# Patient Record
Sex: Male | Born: 1990
Health system: Southern US, Community
[De-identification: ages and names within clinical notes are randomized; demographics above are authoritative.]

## PROBLEM LIST (undated history)

## (undated) DIAGNOSIS — F419 Anxiety disorder, unspecified: Secondary | ICD-10-CM

## (undated) HISTORY — DX: Anxiety disorder, unspecified: F41.9

## (undated) HISTORY — PX: VASECTOMY: SHX75

---
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
## 2012-08-23 ENCOUNTER — Emergency Department: Payer: Self-pay | Admitting: Emergency Medicine

## 2015-01-27 ENCOUNTER — Telehealth: Payer: Self-pay | Admitting: Family Medicine

## 2015-01-27 NOTE — Telephone Encounter (Signed)
Please advise 

## 2015-01-27 NOTE — Telephone Encounter (Signed)
Ok to re-establish 

## 2015-01-27 NOTE — Telephone Encounter (Signed)
Pt called to see if he can get reestablished with Dr. Sherrie MustacheFisher pt states both parents see Dr. Sherrie MustacheFisher, pt states he has not seen any other Dr seen , pt states he just got insured with [employer]. BC# [phone]. Thanks CC

## 2015-01-27 NOTE — Telephone Encounter (Signed)
I spoke with pt and scheduled his re-establish appt on Friday 01/31/15. Thanks TNP

## 2015-01-31 ENCOUNTER — Encounter: Payer: Self-pay | Admitting: Family Medicine

## 2015-01-31 ENCOUNTER — Ambulatory Visit (INDEPENDENT_AMBULATORY_CARE_PROVIDER_SITE_OTHER): Payer: BLUE CROSS/BLUE SHIELD | Admitting: Family Medicine

## 2015-01-31 VITALS — BP 130/88 | HR 67 | Temp 97.8°F | Resp 16 | Ht 72.18 in | Wt 250.0 lb

## 2015-01-31 DIAGNOSIS — F40248 Other situational type phobia: Secondary | ICD-10-CM | POA: Insufficient documentation

## 2015-01-31 DIAGNOSIS — F40298 Other specified phobia: Secondary | ICD-10-CM | POA: Diagnosis not present

## 2015-01-31 MED ORDER — ALPRAZOLAM 0.5 MG PO TABS: 0.2500 mg | ORAL_TABLET | ORAL | Status: DC | PRN

## 2015-01-31 NOTE — Progress Notes (Signed)
Patient: Parker Heath Male    DOB: Jul 14, 1990   24 y.o.   MRN: [id number] Visit Date: 01/31/2015  Today's Provider: Mila Merry, MD   Chief Complaint  Patient presents with  . Establish Care   Subjective:    Anxiety Presents for initial visit. Onset was 6 to 12 months ago (1 yesr). The problem has been waxing and waning. Symptoms include dry mouth, excessive worry, feeling of choking, malaise, muscle tension, nausea, nervous/anxious behavior and panic. Patient reports no chest pain, compulsions, confusion, decreased concentration, depressed mood, dizziness, hyperventilation, impotence, insomnia, irritability, obsessions, palpitations, restlessness, shortness of breath or suicidal ideas. Symptoms occur occasionally. The severity of symptoms is severe, causing significant distress and interfering with daily activities. Exacerbated by: driving for long distances. The patient sleeps 7 hours per night. The quality of sleep is non-restorative. Nighttime awakenings: none.   Risk factors: driving for long distances  Past treatments include counseling (CBT) (behavoral health). The treatment provided no relief. Compliance with prior treatments has been good.   He states had previously worked for [employer] but had to quit because of anxiety attacks when he travelled away from [place]. He now works for [employer]. He feels it caused by being away from home in unfamiliar areas. He has episodes even when traveling with his family and having his wife drive.   No Known Allergies Previous Medications   No medications on file    Review of Systems  Constitutional: Positive for activity change. Negative for irritability.  Eyes: Negative.   Respiratory: Negative.  Negative for shortness of breath.   Cardiovascular: Negative.  Negative for chest pain and palpitations.  Gastrointestinal: Positive for nausea.  Endocrine: Negative.   Genitourinary: Negative.  Negative for impotence.    Musculoskeletal: Negative.   Skin: Negative.   Allergic/Immunologic: Negative.   Neurological: Negative.  Negative for dizziness.  Hematological: Negative.   Psychiatric/Behavioral: Negative for suicidal ideas, confusion and decreased concentration. The patient is nervous/anxious. The patient does not have insomnia.     Social History  Substance Use Topics  . Smoking status: Current Every Day Smoker -- 5 years  . Smokeless tobacco: Not on file  . Alcohol Use: No   Objective:   BP 130/88 mmHg  Pulse 67  Temp(Src) 97.8 F (36.6 C) (Oral)  Resp 16  Ht 6' 0.18" (1.833 m)  Wt 250 lb (113.399 kg)  BMI 33.75 kg/m2  SpO2 97%   Depression screen [facility] 2/9 01/31/2015  Decreased Interest 0  Down, Depressed, Hopeless 0  PHQ - 2 Score 0  Altered sleeping 1  Tired, decreased energy 1  Change in appetite 0  Feeling bad or failure about yourself  0  Trouble concentrating 0  Moving slowly or fidgety/restless 0  Suicidal thoughts 0  PHQ-9 Score 2  Difficult doing work/chores Somewhat difficult      Physical Exam  General appearance: alert, well developed, well nourished, cooperative and in no distress Head: Normocephalic, without obvious abnormality, atraumatic Lungs: Respirations even and unlabored Extremities: No gross deformities Skin: Skin color, texture, turgor normal. No rashes seen  Psych: Appropriate mood and affect. Neurologic: Mental status: Alert, oriented to person, place, and time, thought content appropriate.     Assessment & Plan:     1. Travel anxiety Has had some psychological counseling for this. Discusses options for prn versus maintenance therapy. Consider episodes are infrequent, but interfering with ability to travel periodically with family, he will try prn alprazolam. Advised  that medication may cause sedation and his wife should drive when he does take medication.  - ALPRAZolam (XANAX) 0.5 MG tablet; Take 0.5-1 tablets (0.25-0.5 mg total) by mouth every  4 (four) hours as needed for anxiety.  Dispense: 20 tablet; Refill: 1       Mila Merryonald Robbi Spells, MD  [facility]

## 2015-11-19 ENCOUNTER — Institutional Professional Consult (permissible substitution): Payer: Self-pay | Admitting: Medical

## 2016-04-08 ENCOUNTER — Ambulatory Visit: Payer: 59

## 2017-07-25 DIAGNOSIS — T1512XA Foreign body in conjunctival sac, left eye, initial encounter: Secondary | ICD-10-CM | POA: Diagnosis not present

## 2017-08-22 ENCOUNTER — Ambulatory Visit: Payer: 59 | Admitting: Urology

## 2017-08-25 ENCOUNTER — Encounter: Payer: Self-pay | Admitting: Urology

## 2017-10-11 ENCOUNTER — Encounter: Payer: Self-pay | Admitting: Emergency Medicine

## 2017-10-11 ENCOUNTER — Emergency Department
Admission: EM | Admit: 2017-10-11 | Discharge: 2017-10-11 | Disposition: A | Discharge status: Home / Self Care | Payer: No Typology Code available for payment source | Attending: Emergency Medicine | Admitting: Emergency Medicine

## 2017-10-11 ENCOUNTER — Other Ambulatory Visit: Payer: Self-pay

## 2017-10-11 DIAGNOSIS — T2662XA Corrosion of cornea and conjunctival sac, left eye, initial encounter: Secondary | ICD-10-CM | POA: Insufficient documentation

## 2017-10-11 DIAGNOSIS — X58XXXA Exposure to other specified factors, initial encounter: Secondary | ICD-10-CM | POA: Insufficient documentation

## 2017-10-11 DIAGNOSIS — Y929 Unspecified place or not applicable: Secondary | ICD-10-CM | POA: Insufficient documentation

## 2017-10-11 DIAGNOSIS — Y990 Civilian activity done for income or pay: Secondary | ICD-10-CM | POA: Diagnosis not present

## 2017-10-11 DIAGNOSIS — T2661XA Corrosion of cornea and conjunctival sac, right eye, initial encounter: Secondary | ICD-10-CM | POA: Insufficient documentation

## 2017-10-11 DIAGNOSIS — Y93E5 Activity, floor mopping and cleaning: Secondary | ICD-10-CM | POA: Diagnosis not present

## 2017-10-11 DIAGNOSIS — F17210 Nicotine dependence, cigarettes, uncomplicated: Secondary | ICD-10-CM | POA: Insufficient documentation

## 2017-10-11 DIAGNOSIS — T541X1A Toxic effect of other corrosive organic compounds, accidental (unintentional), initial encounter: Secondary | ICD-10-CM | POA: Diagnosis present

## 2017-10-11 MED ORDER — POLYMYXIN B-TRIMETHOPRIM 10000-0.1 UNIT/ML-% OP SOLN: 2.0000 [drp] | Freq: Four times a day (QID) | OPHTHALMIC | 0 refills | Status: DC

## 2017-10-11 MED ORDER — TETRACAINE HCL 0.5 % OP SOLN
2.0000 [drp] | Freq: Once | OPHTHALMIC | Status: AC
  Administered 2017-10-11: 2 [drp] via OPHTHALMIC

## 2017-10-11 MED ORDER — FLUORESCEIN SODIUM 1 MG OP STRP
ORAL_STRIP | OPHTHALMIC | Status: AC
  Administered 2017-10-11: 2 via OPHTHALMIC
  Filled 2017-10-11: qty 2

## 2017-10-11 MED ORDER — TETRACAINE HCL 0.5 % OP SOLN
OPHTHALMIC | Status: AC
  Administered 2017-10-11: 2 [drp] via OPHTHALMIC
  Filled 2017-10-11: qty 4

## 2017-10-11 MED ORDER — KETOROLAC TROMETHAMINE 0.5 % OP SOLN: 1.0000 [drp] | Freq: Four times a day (QID) | OPHTHALMIC | 0 refills | Status: DC

## 2017-10-11 MED ORDER — FLUORESCEIN SODIUM 1 MG OP STRP
2.0000 | ORAL_STRIP | Freq: Once | OPHTHALMIC | Status: AC
  Administered 2017-10-11: 2 via OPHTHALMIC

## 2017-10-11 NOTE — ED Notes (Signed)

## 2017-10-11 NOTE — ED Notes (Signed)
Pt reports drug screening performed at employer's already.
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
## 2017-10-11 NOTE — ED Notes (Signed)
PA, Cuthriell to bedside at this time.

## 2018-02-15 ENCOUNTER — Encounter: Payer: Self-pay | Admitting: Family Medicine

## 2018-02-15 ENCOUNTER — Ambulatory Visit (INDEPENDENT_AMBULATORY_CARE_PROVIDER_SITE_OTHER): Payer: BLUE CROSS/BLUE SHIELD | Admitting: Family Medicine

## 2018-02-15 VITALS — BP 120/90 | HR 110 | Temp 98.4°F | Resp 16 | Ht 72.0 in | Wt 236.0 lb

## 2018-02-15 DIAGNOSIS — F418 Other specified anxiety disorders: Secondary | ICD-10-CM

## 2018-02-15 DIAGNOSIS — R634 Abnormal weight loss: Secondary | ICD-10-CM

## 2018-02-15 DIAGNOSIS — F40248 Other situational type phobia: Secondary | ICD-10-CM

## 2018-02-15 MED ORDER — ALPRAZOLAM 0.5 MG PO TABS: 0.2500 mg | ORAL_TABLET | ORAL | 1 refills | Status: DC | PRN

## 2018-02-15 MED ORDER — FLUOXETINE HCL 20 MG PO CAPS: 20.0000 mg | ORAL_CAPSULE | Freq: Every day | ORAL | 1 refills | Status: DC

## 2018-02-15 NOTE — Patient Instructions (Signed)
.   Please bring all of your medications to every appointment so we can make sure that our medication list is the same as yours.   

## 2018-02-15 NOTE — Progress Notes (Signed)
Patient: Parker Heath Male    DOB: Aug 16, 1990   27 y.o.   MRN: [id number] Visit Date: 02/15/2018  Today's Provider: Mila Merryonald Fisher, MD   Chief Complaint  Patient presents with  . Establish Care   Subjective:     HPI Re- Establish Care: Patient was a [facility] patient [facility]. His last visit there was 05/07/2016. During that visit, he was seen for Anxiety and Panic Attacks. He was prescribed Fluoxetine and Alprazolam. Patient comes in today to re-establish care and to Discuss issues with Anxiety. He was last seen here in 2016  Anxiety: He reports long history of anxiety with travel for which he has taken alprazolam intermittently . He [facility] as [facility], but only took it for a week or two. He reports that anxiety has become much more persistent and severe over the last month or two. States he is constantly worried about getting the flu. States he has had tendency to think obsessively in the past, but this has been much worse. Feels nervous and jittery, heart races, but also feels sleepy all the time and never wants to get out of bed. He has poor appetite and feels like he has lost weight over the last month.    Wt Readings from Last 3 Encounters:  02/15/18 236 lb (107 kg)  10/11/17 255 lb (115.7 kg)  01/31/15 250 lb (113.4 kg)      No Known Allergies   Current Outpatient Medications:  .  ALPRAZolam (XANAX) 0.5 MG tablet, Take 0.5-1 tablets (0.25-0.5 mg total) by mouth every 4 (four) hours as needed for anxiety. (Patient not taking: Reported on 02/15/2018), Disp: 20 tablet, Rfl: 1  Review of Systems  Constitutional: Positive for unexpected weight change (lost 20 pounds in the past month). Negative for appetite change, chills and fever.  Respiratory: Negative for chest tightness, shortness of breath and wheezing.   Cardiovascular: Negative for chest pain and palpitations.  Gastrointestinal: Negative for abdominal pain, nausea and  vomiting.  Psychiatric/Behavioral: Negative for agitation, decreased concentration, dysphoric mood, hallucinations, self-injury, sleep disturbance and suicidal ideas. The patient is nervous/anxious.     Social History   Tobacco Use  . Smoking status: Former Smoker    Years: 2.00    Types: Cigarettes  . Smokeless tobacco: Never Used  Substance Use Topics  . Alcohol use: No    Alcohol/week: 0.0 standard drinks    Past Medical History:  Diagnosis Date  . Anxiety    Past Surgical History:  Procedure Laterality Date  . TOE SURGERY Left 2006   family history includes Anxiety disorder in his brother; Heart Problems in his brother; Heart disease in his maternal grandmother; Prostate cancer in his father.    Objective:   BP 120/90 (BP Location: Right Arm, Patient Position: Sitting, Cuff Size: Large)   Pulse (!) 110   Temp 98.4 F (36.9 C) (Oral)   Resp 16   Ht 6' (1.829 m)   Wt 236 lb (107 kg)   SpO2 98% Comment: room air  BMI 32.01 kg/m  Vitals:   02/15/18 1058  BP: 120/90  Pulse: (!) 110  Resp: 16  Temp: 98.4 F (36.9 C)  TempSrc: Oral  SpO2: 98%  Weight: 236 lb (107 kg)  Height: 6' (1.829 m)     Physical Exam   General Appearance:    Alert, cooperative, no distress  Eyes:    PERRL, conjunctiva/corneas clear, EOM's intact  Lungs:     Clear to auscultation bilaterally, respirations unlabored  Heart:    Tachycardic, regular rhythm  Neurologic:   Awake, alert, oriented x 3. No apparent focal neurological           defect.          Assessment & Plan    1. Weight loss  - Comprehensive metabolic panel - CBC - TSH  2. Travel anxiety refill - ALPRAZolam (XANAX) 0.5 MG tablet; Take 0.5-1 tablets (0.25-0.5 mg total) by mouth every 4 (four) hours as needed for anxiety.  Dispense: 20 tablet; Refill: 1  3. Anxiety about health Start back on - FLUoxetine (PROZAC) 20 MG capsule; Take 1 capsule (20 mg total) by mouth daily.  Dispense: 30 capsule; Refill:  1 He does have some sx of OCD and will likely benefit from SSRI.   Return in about 4 weeks (around 03/15/2018).     Mila Merryonald Fisher, MD  [facility]

## 2018-02-16 ENCOUNTER — Telehealth: Payer: Self-pay

## 2018-02-16 LAB — COMPREHENSIVE METABOLIC PANEL
ALBUMIN: 5.1 g/dL (ref 3.5–5.5)
ALT: 21 IU/L (ref 0–44)
AST: 16 IU/L (ref 0–40)
Albumin/Globulin Ratio: 2.3 — ABNORMAL HIGH (ref 1.2–2.2)
Alkaline Phosphatase: 99 IU/L (ref 39–117)
BUN / CREAT RATIO: 12 (ref 9–20)
BUN: 12 mg/dL (ref 6–20)
Bilirubin Total: 0.7 mg/dL (ref 0.0–1.2)
CALCIUM: 10.4 mg/dL — AB (ref 8.7–10.2)
CO2: 22 mmol/L (ref 20–29)
CREATININE: 0.99 mg/dL (ref 0.76–1.27)
Chloride: 103 mmol/L (ref 96–106)
GFR, EST AFRICAN AMERICAN: 122 mL/min/{1.73_m2} (ref >59)
GFR, EST NON AFRICAN AMERICAN: 105 mL/min/{1.73_m2} (ref >59)
GLUCOSE: 98 mg/dL (ref 65–99)
Globulin, Total: 2.2 g/dL (ref 1.5–4.5)
Potassium: 4.3 mmol/L (ref 3.5–5.2)
Sodium: 140 mmol/L (ref 134–144)
TOTAL PROTEIN: 7.3 g/dL (ref 6.0–8.5)

## 2018-02-16 LAB — CBC
HEMATOCRIT: 48.7 % (ref 37.5–51.0)
Hemoglobin: 16.6 g/dL (ref 13.0–17.7)
MCH: 29.4 pg (ref 26.6–33.0)
MCHC: 34.1 g/dL (ref 31.5–35.7)
MCV: 86 fL (ref 79–97)
Platelets: 374 10*3/uL (ref 150–450)
RBC: 5.65 x10E6/uL (ref 4.14–5.80)
RDW: 12.4 % (ref 11.6–15.4)
WBC: 6.6 10*3/uL (ref 3.4–10.8)

## 2018-02-16 LAB — TSH: TSH: 2.02 u[IU]/mL (ref 0.450–4.500)

## 2018-02-16 NOTE — Telephone Encounter (Signed)
-----   Message from Malva Limesonald E Fisher, MD sent at 02/16/2018  8:04 AM EST ----- Labs are all normal. Go ahead and start fluoxetine and follow up in February as scheduled.

## 2018-02-16 NOTE — Telephone Encounter (Signed)
Pt advised.   Thanks,   -Laura  

## 2018-03-08 ENCOUNTER — Ambulatory Visit: Payer: Self-pay | Admitting: Family Medicine

## 2018-03-09 ENCOUNTER — Other Ambulatory Visit: Payer: Self-pay | Admitting: Family Medicine

## 2018-03-09 DIAGNOSIS — F418 Other specified anxiety disorders: Secondary | ICD-10-CM

## 2019-02-09 HISTORY — PX: VASECTOMY: SHX75

## 2019-08-24 ENCOUNTER — Telehealth (INDEPENDENT_AMBULATORY_CARE_PROVIDER_SITE_OTHER): Payer: BLUE CROSS/BLUE SHIELD | Admitting: Family Medicine

## 2019-08-24 ENCOUNTER — Ambulatory Visit: Payer: Self-pay

## 2019-08-24 ENCOUNTER — Other Ambulatory Visit: Payer: Self-pay

## 2019-08-24 ENCOUNTER — Encounter: Payer: Self-pay | Admitting: Family Medicine

## 2019-08-24 ENCOUNTER — Ambulatory Visit (INDEPENDENT_AMBULATORY_CARE_PROVIDER_SITE_OTHER): Payer: Managed Care, Other (non HMO)

## 2019-08-24 ENCOUNTER — Encounter: Payer: Self-pay | Admitting: Emergency Medicine

## 2019-08-24 ENCOUNTER — Ambulatory Visit
Admission: EM | Admit: 2019-08-24 | Discharge: 2019-08-24 | Disposition: A | Discharge status: Home / Self Care | Payer: Managed Care, Other (non HMO) | Attending: Emergency Medicine | Admitting: Emergency Medicine

## 2019-08-24 DIAGNOSIS — R1030 Lower abdominal pain, unspecified: Secondary | ICD-10-CM

## 2019-08-24 DIAGNOSIS — Z5329 Procedure and treatment not carried out because of patient's decision for other reasons: Secondary | ICD-10-CM

## 2019-08-24 DIAGNOSIS — R10819 Abdominal tenderness, unspecified site: Secondary | ICD-10-CM | POA: Insufficient documentation

## 2019-08-24 LAB — CBC WITH DIFFERENTIAL/PLATELET
Abs Immature Granulocytes: 0.04 10*3/uL (ref 0.00–0.07)
Basophils Absolute: 0.1 10*3/uL (ref 0.0–0.1)
Basophils Relative: 1 %
Eosinophils Absolute: 0.2 10*3/uL (ref 0.0–0.5)
Eosinophils Relative: 2 %
HCT: 46.2 % (ref 39.0–52.0)
Hemoglobin: 16.1 g/dL (ref 13.0–17.0)
Immature Granulocytes: 0 %
Lymphocytes Relative: 25 %
Lymphs Abs: 2.7 10*3/uL (ref 0.7–4.0)
MCH: 29.4 pg (ref 26.0–34.0)
MCHC: 34.8 g/dL (ref 30.0–36.0)
MCV: 84.5 fL (ref 80.0–100.0)
Monocytes Absolute: 0.7 10*3/uL (ref 0.1–1.0)
Monocytes Relative: 7 %
Neutro Abs: 7 10*3/uL (ref 1.7–7.7)
Neutrophils Relative %: 65 %
Platelets: 375 10*3/uL (ref 150–400)
RBC: 5.47 MIL/uL (ref 4.22–5.81)
RDW: 11.9 % (ref 11.5–15.5)
WBC: 10.7 10*3/uL — ABNORMAL HIGH (ref 4.0–10.5)
nRBC: 0 % (ref 0.0–0.2)

## 2019-08-24 LAB — URINALYSIS, COMPLETE (UACMP) WITH MICROSCOPIC
Bacteria, UA: NONE SEEN
Bilirubin Urine: NEGATIVE
Glucose, UA: NEGATIVE mg/dL
Hgb urine dipstick: NEGATIVE
Leukocytes,Ua: NEGATIVE
Nitrite: NEGATIVE
Protein, ur: NEGATIVE mg/dL
RBC / HPF: NONE SEEN RBC/hpf (ref 0–5)
Specific Gravity, Urine: >1.03 — ABNORMAL HIGH (ref 1.005–1.030)
Squamous Epithelial / LPF: NONE SEEN (ref 0–5)
pH: 5 (ref 5.0–8.0)

## 2019-08-24 LAB — BASIC METABOLIC PANEL
Anion gap: 6 (ref 5–15)
BUN: 15 mg/dL (ref 6–20)
CO2: 27 mmol/L (ref 22–32)
Calcium: 9.6 mg/dL (ref 8.9–10.3)
Chloride: 105 mmol/L (ref 98–111)
Creatinine, Ser: 0.92 mg/dL (ref 0.61–1.24)
GFR calc Af Amer: >60 mL/min (ref >60)
GFR calc non Af Amer: >60 mL/min (ref >60)
Glucose, Bld: 110 mg/dL — ABNORMAL HIGH (ref 70–99)
Potassium: 4.1 mmol/L (ref 3.5–5.1)
Sodium: 138 mmol/L (ref 135–145)

## 2019-08-24 NOTE — ED Provider Notes (Signed)
HPI  SUBJECTIVE:  Parker Heath is a 29 y.o. male who presents with 3 days of constant, nonmigratory, nonradiating dull pressure-like low midline abdominal pain.  It is not getting worse, but it is not resolving.  He reports 3 episodes of watery, nonbloody diarrhea per day.  He states that he normally has issues with loose stools, but that this is different because it is more watery than usual.  No nausea, vomiting.  No melena.  No anorexia.  No urinary complaints, back pain.  No penile rash, discharge, scrotal, testicular pain or swelling.  No abdominal distention although he states he feels bloated.  No recent travel, camping, raw or undercooked foods, questionable leftovers.  He does not take any medications on a regular basis.  He is not on any medications currently.  He has not tried anything for this.  Symptoms are better with sitting down and not moving, worse with driving over bumps, standing up straight and with eating.  It is not associated with having a bowel movement.  Past medical history negative for diabetes, hypertension, UTI, pyelonephritis, nephrolithiasis, abdominal surgeries, pancreatitis, appendicitis, testicular torsion, epididymitis.  He had a vasectomy 2 months ago.  Family history significant for father, multiple brothers with IBS.  PMD: [facility] [place].    Past Medical History:  Diagnosis Date  . Anxiety     Past Surgical History:  Procedure Laterality Date  . TOE SURGERY Left 2006  . VASECTOMY      Family History  Problem Relation Age of Onset  . Prostate cancer Father   . Heart disease Maternal Grandmother   . Anxiety disorder Brother   . Heart Problems Brother     Social History   Tobacco Use  . Smoking status: Former Smoker    Years: 2.00    Types: Cigarettes  . Smokeless tobacco: Never Used  Vaping Use  . Vaping Use: Never used  Substance Use Topics  . Alcohol use: No    Alcohol/week: 0.0 standard drinks  . Drug use: No    No current  facility-administered medications for this encounter.  Current Outpatient Medications:  .  ALPRAZolam (XANAX) 0.5 MG tablet, Take 0.5-1 tablets (0.25-0.5 mg total) by mouth every 4 (four) hours as needed for anxiety., Disp: 20 tablet, Rfl: 1 .  FLUoxetine (PROZAC) 20 MG capsule, TAKE 1 CAPSULE BY MOUTH EVERY DAY, Disp: 30 capsule, Rfl: 0  No Known Allergies   ROS  As noted in HPI.   Physical Exam  BP 120/74 (BP Location: Right Arm)   Pulse 73   Temp 98.2 F (36.8 C) (Oral)   Resp 16   Ht 6\' 1"  (1.854 m)   Wt 114.3 kg   SpO2 98%   BMI 33.25 kg/m   Constitutional: Well developed, well nourished, no acute distress Eyes:  EOMI, conjunctiva normal bilaterally HENT: Normocephalic, atraumatic,mucus membranes moist Respiratory: Normal inspiratory effort Cardiovascular: Normal rate GI: Normal appearance positive suprapubic> right lower quadrant = left lower quadrant tenderness.  Tenderness maximal in the suprapubic region.  Questionable palpable bladder.  No guarding, rebound.  Active bowel sounds.  Negative tap table test. Back: No CVAT GU: Declined Rectal: Declined skin: No rash, skin intact Musculoskeletal: no deformities Neurologic: Alert & oriented x 3, no focal neuro deficits Psychiatric: Speech and behavior appropriate   ED Course   Medications - No data to display  Orders Placed This Encounter  Procedures  . Abdomen Limited    Standing Status:   Standing  Number of Occurrences:   1    Order Specific Question:   Reason for Exam (SYMPTOM  OR DIAGNOSIS REQUIRED)    Answer:   midline low abd pain r/o urinary retention, appendicitis  . Urinalysis, Complete w Microscopic    Standing Status:   Standing    Number of Occurrences:   1  . CBC with Differential    Standing Status:   Standing    Number of Occurrences:   1  . Basic metabolic panel    Standing Status:   Standing    Number of Occurrences:   1    Results for orders placed or performed during the  hospital encounter of 08/24/19 (from the past 24 hour(s))  Urinalysis, Complete w Microscopic     Status: Abnormal   Collection Time: 08/24/19  3:03 PM  Result Value Ref Range   Color, Urine AMBER (A) YELLOW   APPearance CLEAR CLEAR   Specific Gravity, Urine >1.030 (H) 1.005 - 1.030   pH 5.0 5.0 - 8.0   Glucose, UA NEGATIVE NEGATIVE mg/dL   Hgb urine dipstick NEGATIVE NEGATIVE   Bilirubin Urine NEGATIVE NEGATIVE   Ketones, ur TRACE (A) NEGATIVE mg/dL   Protein, ur NEGATIVE NEGATIVE mg/dL   Nitrite NEGATIVE NEGATIVE   Leukocytes,Ua NEGATIVE NEGATIVE   Squamous Epithelial / LPF NONE SEEN 0 - 5   WBC, UA 0-5 0 - 5 WBC/hpf   RBC / HPF NONE SEEN 0 - 5 RBC/hpf   Bacteria, UA NONE SEEN NONE SEEN   Mucus PRESENT   CBC with Differential     Status: Abnormal   Collection Time: 08/24/19  3:03 PM  Result Value Ref Range   WBC 10.7 (H) 4.0 - 10.5 K/uL   RBC 5.47 4.22 - 5.81 MIL/uL   Hemoglobin 16.1 13.0 - 17.0 g/dL   HCT 82.5 39 - 52 %   MCV 84.5 80.0 - 100.0 fL   MCH 29.4 26.0 - 34.0 pg   MCHC 34.8 30.0 - 36.0 g/dL   RDW 05.3 97.6 - 73.4 %   Platelets 375 150 - 400 K/uL   nRBC 0.0 0.0 - 0.2 %   Neutrophils Relative % 65 %   Neutro Abs 7.0 1.7 - 7.7 K/uL   Lymphocytes Relative 25 %   Lymphs Abs 2.7 0.7 - 4.0 K/uL   Monocytes Relative 7 %   Monocytes Absolute 0.7 0 - 1 K/uL   Eosinophils Relative 2 %   Eosinophils Absolute 0.2 0 - 0 K/uL   Basophils Relative 1 %   Basophils Absolute 0.1 0 - 0 K/uL   Immature Granulocytes 0 %   Abs Immature Granulocytes 0.04 0.00 - 0.07 K/uL  Basic metabolic panel     Status: Abnormal   Collection Time: 08/24/19  3:03 PM  Result Value Ref Range   Sodium 138 135 - 145 mmol/L   Potassium 4.1 3.5 - 5.1 mmol/L   Chloride 105 98 - 111 mmol/L   CO2 27 22 - 32 mmol/L   Glucose, Bld 110 (H) 70 - 99 mg/dL   BUN 15 6 - 20 mg/dL   Creatinine, Ser 1.93 0.61 - 1.24 mg/dL   Calcium 9.6 8.9 - 79.0 mg/dL   GFR calc non Af Amer >60 >60 mL/min   GFR calc Af  Amer >60 >60 mL/min   Anion gap 6 5 - 15   US Abdomen Limited  Result Date: 08/24/2019 CLINICAL DATA:  Acute lower abdominal pain. EXAM: ULTRASOUND ABDOMEN LIMITED  COMPARISON:  November 13, 2007. FINDINGS: Limited sonographic evaluation was performed in the right lower quadrant of the abdomen and the pelvis. No definite evidence of mass, cyst or fluid collection is noted. The appendix is not visualized. The urinary bladder is unremarkable. It has a calculated prevoid volume of 165 mL. Postvoid volume could not be obtained as the patient could not void. IMPRESSION: No definite abnormality seen in this limited evaluation of the abdomen. Electronically Signed   By: Lupita Raider M.D.   On: 08/24/2019 15:19    ED Clinical Impression  1. Lower abdominal tenderness   2. Lower abdominal pain      ED Assessment/Plan  In the differential is UTI, urinary retention, colitis, appendicitis.  He does not have any peritoneal signs on exam . There is a questionable palpable bladder.  Will get limited ultrasound of the abdomen.  Discussed with patient that his pain could be caused by hernia, GU, or prostate issue, but patient declined these exams.  Also checking CBC, BMP, UA.  Reviewed imaging report.  Appendix not visualized, urinary bladder unremarkable.  Prevoid volume of 165 mL.  No definite abnormality seen.  See radiology report for full details.  BMP normal.  Borderline leukocytosis.  Otherwise CBC normal.  Unremarkable ultrasound-nondistended bladder.  Urinalysis shows concentrated urine with trace ketones.  No evidence of UTI.  Doubt prostatitis.  Unsure as to the etiology of the patient's symptoms, however he does not appear to have a surgical abdomen, no peritoneal signs. Doubt SBO, mesenteric ischemia, appendicitis, hepatitis, cholecystitis, pancreatitis, or perforated viscus. No historical evidence to suggest testicular source for abdominal pain.  On re evaluation, patient is comfortably walking  around.  He states that that he has pressure more than pain.  states that he drinks a lot of soda during the day, does not drink a whole lot of water.  Discussed urinalysis with him and emphasized importance of increasing water intake.  He is to try Imodium to see if that can slow down his diarrhea.  Follow-up with his primary care physician as needed, gave him strict ER return precautions.  Discussed labs, imaging, MDM, treatment plan, and plan for follow-up with patient. Discussed sn/sx that should prompt return to the ED. patient agrees with plan.   No orders of the defined types were placed in this encounter.   *This clinic note was created using Dragon dictation software. Therefore, there may be occasional mistakes despite careful proofreading.   ?    Domenick Gong, MD 08/24/19 [phone]

## 2019-08-24 NOTE — ED Triage Notes (Signed)
Patient c/o pain in his lower abdomen and diarrhea that started 3 days ago. Patient denies fevers.

## 2019-08-24 NOTE — Discharge Instructions (Addendum)
Your urine is very concentrated, meaning you do not drink enough water.  Make sure you drink at least 2 L of nonsugary, noncaffeinated, nonalcoholic beverages per day.  Try and cut back on soda.  Try some Imodium to help with the diarrhea.  Go immediately to the emergency room for fevers above 100.4, worsening pain, if you are unable to empty your bladder, nausea vomiting, back pain, or for any other concerns

## 2019-08-24 NOTE — Progress Notes (Signed)
No show

## 2019-08-24 NOTE — Telephone Encounter (Signed)
Pt. Reports 3 days of lower abdominal pain and pressure. Has had "about 3 loose" stools a day. "I don't feel too bad, just this pressure that won't go away." Virtual visit for today.  Reason for Disposition . [1] MILD pain (e.g., does not interfere with normal activities) AND [2] pain comes and goes (cramps) [3] present > 48 hours  (Exception: this same abdominal pain is a chronic symptom recurrent or ongoing AND present > 4 weeks) . [1] MODERATE pain (e.g., interferes with normal activities) AND [2] pain comes and goes (cramps) AND [3] present > 24 hours  (Exception: pain with Vomiting or Diarrhea - see that Guideline)  Answer Assessment - Initial Assessment Questions 1. LOCATION: "Where does it hurt?"      Hurts lower abd. 2. RADIATION: "Does the pain shoot anywhere else?" (e.g., chest, back)     No 3. ONSET: "When did the pain begin?" (Minutes, hours or days ago)      2-3 days ago 4. SUDDEN: "Gradual or sudden onset?"     Sudden 5. PATTERN "Does the pain come and go, or is it constant?"    - If constant: "Is it getting better, staying the same, or worsening?"      (Note: Constant means the pain never goes away completely; most serious pain is constant and it progresses)     - If intermittent: "How long does it last?" "Do you have pain now?"     (Note: Intermittent means the pain goes away completely between bouts)     Constant 6. SEVERITY: "How bad is the pain?"  (e.g., Scale 1-10; mild, moderate, or severe)    - MILD (1-3): doesn't interfere with normal activities, abdomen soft and not tender to touch     - MODERATE (4-7): interferes with normal activities or awakens from sleep, tender to touch     - SEVERE (8-10): excruciating pain, doubled over, unable to do any normal activities       4 7. RECURRENT SYMPTOM: "Have you ever had this type of stomach pain before?" If Yes, ask: "When was the last time?" and "What happened that time?"      No 8. CAUSE: "What do you think is causing the  stomach pain?"     Unsure 9. RELIEVING/AGGRAVATING FACTORS: "What makes it better or worse?" (e.g., movement, antacids, bowel movement)     No 10. OTHER SYMPTOMS: "Has there been any vomiting, diarrhea, constipation, or urine problems?"       Diarrhea  Protocols used: ABDOMINAL PAIN - MALE-A-AH

## 2019-10-29 ENCOUNTER — Encounter: Payer: Self-pay | Admitting: Family Medicine

## 2019-10-29 ENCOUNTER — Other Ambulatory Visit: Payer: Self-pay

## 2019-10-29 ENCOUNTER — Ambulatory Visit (INDEPENDENT_AMBULATORY_CARE_PROVIDER_SITE_OTHER): Payer: Managed Care, Other (non HMO) | Admitting: Family Medicine

## 2019-10-29 VITALS — BP 115/77 | HR 77 | Temp 98.6°F | Ht 73.0 in | Wt 256.0 lb

## 2019-10-29 DIAGNOSIS — N50811 Right testicular pain: Secondary | ICD-10-CM

## 2019-10-29 MED ORDER — NAPROXEN 500 MG PO TABS: 500.0000 mg | ORAL_TABLET | Freq: Two times a day (BID) | ORAL | 0 refills | Status: DC

## 2019-10-29 NOTE — Patient Instructions (Signed)
.   Please review the attached list of medications and notify my office if there are any errors.   . Please bring all of your medications to every appointment so we can make sure that our medication list is the same as yours.   

## 2019-10-29 NOTE — Progress Notes (Signed)
     Established patient visit   Patient: Parker Heath   DOB: [birth year]   29 y.o. Male  MRN: [id number] Visit Date: 10/29/2019  Today's healthcare provider: Mila Merry, MD   Chief Complaint  Patient presents with  . Penis Pain   Subjective    HPI  Penile pain: Patient reports that he has had testicular pain. He reports that he had a vasectomy about 2 months ago by Dr. Sheppard Penton, and he has had pain ever since. 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 Pain is primarily on the right testicle. Is a little more swollen on the right side. He takes 2 ibuprofen occasionally which helps for a few hours. He states he had to leave work early last week due to pain. He rested over the weekend and is better today.  He does have appt with Dr. Evelene Croon on 11/07/2019     Medications: Outpatient Medications Prior to Visit  Medication Sig  . ALPRAZolam (XANAX) 0.5 MG tablet Take 0.5-1 tablets (0.25-0.5 mg total) by mouth every 4 (four) hours as needed for anxiety.  Marland Kitchen FLUoxetine (PROZAC) 20 MG capsule TAKE 1 CAPSULE BY MOUTH EVERY DAY (Patient not taking: Reported on 10/29/2019)   No facility-administered medications prior to visit.     Review of Systems  Constitutional: Negative.   Gastrointestinal: Negative.   Genitourinary: Positive for testicular pain. Negative for decreased urine volume, difficulty urinating, discharge, dysuria, enuresis, frequency, genital sores, hematuria, penile swelling, scrotal swelling and urgency.     Objective    BP 115/77 (BP Location: Right Arm)   Pulse 77   Temp 98.6 F (37 C)   Ht 6\' 1"  (1.854 m)   Wt 256 lb (116.1 kg)   BMI 33.78 kg/m   Physical Exam  Tender right testicle, moreso inferiorly. No masses or unusual swelling.   No results found for any visits on 10/29/19.  Assessment & Plan     1. Pain in right testicle Slowly improving since vasectomy which he states was 2 months ago. No sign of infection or  abnormal swelling. Will get on scheduled NSAID for the next several day and avoid heavy lifting until he sees Dr. 10/31/19 next week.   - naproxen (NAPROSYN) 500 MG tablet; Take 1 tablet (500 mg total) by mouth 2 (two) times daily with a meal.  Dispense: 30 tablet; Refill: 0    He anticipates getting flu shot at his pharmacy.        The entirety of the information documented in the History of Present Illness, Review of Systems and Physical Exam were personally obtained by me. Portions of this information were initially documented by the CMA and reviewed by me for thoroughness and accuracy.      Evelene Croon, MD  [facility] [phone] (phone) [phone] (fax)  [facility]

## 2020-03-11 ENCOUNTER — Telehealth (INDEPENDENT_AMBULATORY_CARE_PROVIDER_SITE_OTHER): Payer: Managed Care, Other (non HMO) | Admitting: Physician Assistant

## 2020-03-11 DIAGNOSIS — U071 COVID-19: Secondary | ICD-10-CM

## 2020-03-11 DIAGNOSIS — R059 Cough, unspecified: Secondary | ICD-10-CM | POA: Diagnosis not present

## 2020-03-11 MED ORDER — PROMETHAZINE-DM 6.25-15 MG/5ML PO SYRP: 5.0000 mL | ORAL_SOLUTION | Freq: Every evening | ORAL | 0 refills | Status: DC | PRN

## 2020-03-11 NOTE — Progress Notes (Signed)
MyChart Video Visit    Virtual Visit via Video Note   This visit type was conducted due to national recommendations for restrictions regarding the COVID-19 Pandemic (e.g. social distancing) in an effort to limit this patient's exposure and mitigate transmission in our community. This patient is at least at moderate risk for complications without adequate follow up. This format is felt to be most appropriate for this patient at this time. Physical exam was limited by quality of the video and audio technology used for the visit.   Patient location: Home Provider location: Office  I discussed the limitations of evaluation and management by telemedicine and the availability of in person appointments. The patient expressed understanding and agreed to proceed.  Patient: Parker Heath   DOB: February 22, 1990   30 y.o. Male  MRN: [id number] Visit Date: 03/11/2020  Today's healthcare provider: Trey Sailors, PA-C   Chief Complaint  Patient presents with  . Covid Positive   Subjective    HPI   Patient presents today with COVID 19. He tested positive last Thursday 03/06/2020. He had sore throat and was seen by MD live on 03/10/2019 and he was prescribed amoxicillin for potential strep. He is continuing this. He reports doing alka seltzer for cough and congestion. He no longer has fevers, only had fever one day. He reports oxygen is good. No SOB, wheezing, nausea, vomiting, body aches.     Medications: Outpatient Medications Prior to Visit  Medication Sig  . ALPRAZolam (XANAX) 0.5 MG tablet Take 0.5-1 tablets (0.25-0.5 mg total) by mouth every 4 (four) hours as needed for anxiety.  . naproxen (NAPROSYN) 500 MG tablet Take 1 tablet (500 mg total) by mouth 2 (two) times daily with a meal.   No facility-administered medications prior to visit.    Review of Systems    Objective    There were no vitals taken for this visit.   Physical Exam Constitutional:      Appearance: Normal  appearance.  Pulmonary:     Effort: Pulmonary effort is normal. No respiratory distress.  Neurological:     Mental Status: He is alert.  Psychiatric:        Mood and Affect: Mood normal.        Behavior: Behavior normal.        Assessment & Plan    1. COVID-19  Your COVID test is positive. You should remain isolated and quarantine for at least 5 days from start of symptoms. You must be feeling better and be fever free without any fever reducers for at least 24 hours as well. You should wear a mask at all times when out of your home or around others for 5 days after leaving isolation.  Your household contacts should be tested as well as work contacts. If you feel worse or have increasing shortness of breath, you should be seen in person at urgent care or the emergency room.   - promethazine-dextromethorphan (PROMETHAZINE-DM) 6.25-15 MG/5ML syrup; Take 5 mLs by mouth at bedtime as needed.  Dispense: 118 mL; Refill: 0  2. Cough  - promethazine-dextromethorphan (PROMETHAZINE-DM) 6.25-15 MG/5ML syrup; Take 5 mLs by mouth at bedtime as needed.  Dispense: 118 mL; Refill: 0   Return if symptoms worsen or fail to improve.     I discussed the assessment and treatment plan with the patient. The patient was provided an opportunity to ask questions and all were answered. The patient agreed with the plan and demonstrated an understanding of  the instructions.   The patient was advised to call back or seek an in-person evaluation if the symptoms worsen or if the condition fails to improve as anticipated.   ITrey Sailors, PA-C, have reviewed all documentation for this visit. The documentation on 03/11/20 for the exam, diagnosis, procedures, and orders are all accurate and complete.  The entirety of the information documented in the History of Present Illness, Review of Systems and Physical Exam were personally obtained by me. Portions of this information were initially documented by [facility] and reviewed by me for thoroughness and accuracy.    Maryella Shivers [facility] [phone] (phone) [phone] (fax)  [facility]

## 2020-03-11 NOTE — Patient Instructions (Signed)
COVID-19 Quarantine vs. Isolation QUARANTINE keeps someone who was in close contact with someone who has COVID-19 away from others. Quarantine if you have been in close contact with someone who has COVID-19, unless you have been fully vaccinated. If you are fully vaccinated  You do NOT need to quarantine unless they have symptoms  Get tested 3-5 days after your exposure, even if you don't have symptoms  Wear a mask indoors in public for 14 days following exposure or until your test result is negative If you are not fully vaccinated  Stay home for 14 days after your last contact with a person who has COVID-19  Watch for fever (100.4F), cough, shortness of breath, or other symptoms of COVID-19  If possible, stay away from people you live with, especially people who are at higher risk for getting very sick from COVID-19  Contact your local public health department for options in your area to possibly shorten your quarantine ISOLATION keeps someone who is sick or tested positive for COVID-19 without symptoms away from others, even in their own home. People who are in isolation should stay home and stay in a specific "sick room" or area and use a separate bathroom (if available). If you are sick and think or know you have COVID-19 Stay home until after  At least 10 days since symptoms first appeared and  At least 24 hours with no fever without the use of fever-reducing medications and  Symptoms have improved If you tested positive for COVID-19 but do not have symptoms  Stay home until after 10 days have passed since your positive viral test  If you develop symptoms after testing positive, follow the steps above for those who are sick cdc.gov/coronavirus 11/05/2019 This information is not intended to replace advice given to you by your health care provider. Make sure you discuss any questions you have with your health care provider. Document Revised: 12/10/2019 Document Reviewed:  12/10/2019 Elsevier Patient Education  2021 Elsevier Inc.  

## 2020-12-09 ENCOUNTER — Telehealth: Payer: Self-pay

## 2020-12-09 NOTE — Telephone Encounter (Signed)
I think the 10am Friday is available.... not sure why it says 'wait list.' Otherwise would recommend Mychart Virtual Urgent Care

## 2020-12-09 NOTE — Telephone Encounter (Signed)
Copied from CRM [id number]. Topic: Appointment Scheduling - Prior Auth Required for Appointment >> Dec 09, 2020  9:52 AM Louie Bun, Rosey Bath D wrote: No appointment has been scheduled. Patient is requesting work-in virtual appointment for cold like symptoms possible flu. Per scheduling protocol, this appointment requires a prior authorization prior to scheduling.  Route to department's PEC pool.

## 2020-12-09 NOTE — Telephone Encounter (Signed)
Spoke with patient regarding an appt. He really wants to be tested for the flu so I suggested he go to [facility].  Told him if he still wanted to have a virtual appt, he could call us back w/ results and we can schedule him.

## 2020-12-09 NOTE — Telephone Encounter (Signed)
Please review.  Can you fit him in somewhere?  Thanks,   -Ezzie Senat  

## 2020-12-09 NOTE — Telephone Encounter (Signed)
Copied from CRM [id number]. Topic: Appointment Scheduling - Prior Auth Required for Appointment >> Dec 09, 2020  9:52 AM Parker Heath, Rosey Bath D wrote: No appointment has been scheduled. Patient is requesting work-in virtual appointment for cold like symptoms possible flu. Per scheduling protocol, this appointment requires a prior authorization prior to scheduling.  Route to department's PEC pool.

## 2020-12-13 ENCOUNTER — Ambulatory Visit
Admission: EM | Admit: 2020-12-13 | Discharge: 2020-12-13 | Disposition: A | Discharge status: Home / Self Care | Payer: Managed Care, Other (non HMO) | Attending: Emergency Medicine | Admitting: Emergency Medicine

## 2020-12-13 ENCOUNTER — Encounter: Payer: Self-pay | Admitting: Emergency Medicine

## 2020-12-13 ENCOUNTER — Other Ambulatory Visit: Payer: Self-pay

## 2020-12-13 DIAGNOSIS — J0100 Acute maxillary sinusitis, unspecified: Secondary | ICD-10-CM

## 2020-12-13 DIAGNOSIS — B349 Viral infection, unspecified: Secondary | ICD-10-CM | POA: Diagnosis not present

## 2020-12-13 MED ORDER — AMOXICILLIN 875 MG PO TABS: 875.0000 mg | ORAL_TABLET | Freq: Two times a day (BID) | ORAL | 0 refills | Status: AC

## 2020-12-13 NOTE — Discharge Instructions (Addendum)
Continue symptomatic treatment for the next day or so.  If your symptoms are still not improving, start the amoxicillin.    Follow up with your primary care provider if your symptoms are not improving.

## 2020-12-13 NOTE — ED Triage Notes (Signed)
Pt here with congestion, cough, sore throat, head pressure, and previous fever x 5 days. Testes negative for covid twice. Requesting flu test.

## 2020-12-13 NOTE — ED Provider Notes (Signed)
Renaldo Fiddler    CSN: [id number] Arrival date & time: 12/13/20  5465      History   Chief Complaint Chief Complaint  Patient presents with   Sore Throat   Cough   Fever    HPI Parker Heath is a 30 y.o. male.  Patient presents with 6-day history of sinus congestion, sinus pressure, postnasal drip, sinus pain, cough, ear fullness, scratchy throat.  He had a fever early in his symptoms but this resolved.  He denies rash, shortness of breath, vomiting, diarrhea, or other symptoms.  Treatment at home with OTC cold and flu medication.  Patient requests a flu test.   The history is provided by the patient.   Past Medical History:  Diagnosis Date   Anxiety     Patient Active Problem List   Diagnosis Date Noted   Travel anxiety 01/31/2015    Past Surgical History:  Procedure Laterality Date   TOE SURGERY Left 2006   VASECTOMY  2021   Dr. Evelene Croon       Home Medications    Prior to Admission medications   Medication Sig Start Date End Date Taking? Authorizing Provider  amoxicillin (AMOXIL) 875 MG tablet Take 1 tablet (875 mg total) by mouth 2 (two) times daily for 10 days. 12/13/20 12/23/20 Yes Mickie Bail, NP  ALPRAZolam Prudy Feeler) 0.5 MG tablet Take 0.5-1 tablets (0.25-0.5 mg total) by mouth every 4 (four) hours as needed for anxiety. 02/15/18   Malva Limes, MD  naproxen (NAPROSYN) 500 MG tablet Take 1 tablet (500 mg total) by mouth 2 (two) times daily with a meal. 10/29/19   Malva Limes, MD  promethazine-dextromethorphan (PROMETHAZINE-DM) 6.25-15 MG/5ML syrup Take 5 mLs by mouth at bedtime as needed. 03/11/20   Trey Sailors, PA-C    Family History Family History  Problem Relation Age of Onset   Prostate cancer Father    Heart disease Maternal Grandmother    Anxiety disorder Brother    Heart Problems Brother     Social History Social History   Tobacco Use   Smoking status: Former    Years: 2.00    Types: Cigarettes   Smokeless tobacco: Never   Vaping Use   Vaping Use: Never used  Substance Use Topics   Alcohol use: No    Alcohol/week: 0.0 standard drinks   Drug use: No     Allergies   Patient has no known allergies.   Review of Systems Review of Systems  Constitutional:  Negative for chills and fever.  HENT:  Positive for congestion, ear pain, postnasal drip, rhinorrhea, sinus pressure, sinus pain and sore throat.   Respiratory:  Positive for cough. Negative for shortness of breath.   Cardiovascular:  Negative for chest pain and palpitations.  Gastrointestinal:  Negative for diarrhea and vomiting.  Skin:  Negative for color change and rash.  All other systems reviewed and are negative.   Physical Exam Triage Vital Signs ED Triage Vitals  Enc Vitals Group     BP      Pulse      Resp      Temp      Temp src      SpO2      Weight      Height      Head Circumference      Peak Flow      Pain Score      Pain Loc      Pain Edu?  Excl. in [place]?    No data found.  Updated Vital Signs BP 115/74 (BP Location: Left Arm)   Pulse 85   Temp 98.9 F (37.2 C) (Oral)   Resp 18   SpO2 96%   Visual Acuity Right Eye Distance:   Left Eye Distance:   Bilateral Distance:    Right Eye Near:   Left Eye Near:    Bilateral Near:     Physical Exam Vitals and nursing note reviewed.  Constitutional:      General: He is not in acute distress.    Appearance: He is well-developed.  HENT:     Head: Normocephalic and atraumatic.     Right Ear: Tympanic membrane normal.     Left Ear: Tympanic membrane normal.     Nose: Congestion and rhinorrhea present.     Mouth/Throat:     Mouth: Mucous membranes are moist.     Pharynx: Oropharynx is clear.  Eyes:     Conjunctiva/sclera: Conjunctivae normal.  Cardiovascular:     Rate and Rhythm: Normal rate and regular rhythm.     Heart sounds: Normal heart sounds.  Pulmonary:     Effort: Pulmonary effort is normal. No respiratory distress.     Breath sounds: Normal  breath sounds.  Abdominal:     Palpations: Abdomen is soft.     Tenderness: There is no abdominal tenderness.  Musculoskeletal:     Cervical back: Neck supple.  Skin:    General: Skin is warm and dry.  Neurological:     Mental Status: He is alert.  Psychiatric:        Mood and Affect: Mood normal.        Behavior: Behavior normal.     UC Treatments / Results  Labs (all labs ordered are listed, but only abnormal results are displayed) Labs Reviewed  COVID-19, FLU A+B NAA    EKG   Radiology No results found.  Procedures Procedures (including critical care time)  Medications Ordered in UC Medications - No data to display  Initial Impression / Assessment and Plan / UC Course  I have reviewed the triage vital signs and the nursing notes.  Pertinent labs & imaging results that were available during my care of the patient were reviewed by me and considered in my medical decision making (see chart for details).   Acute sinusitis, Viral illness.  Per patient request for testing, COVID and Flu pending.  Instructed patient to self quarantine per CDC guidelines.  Treating sinus infection with amoxicillin.  Discussed symptomatic treatment including Tylenol or ibuprofen, rest, hydration.  Instructed patient to follow up with PCP if symptoms are not improving.  Patient agrees to plan of care.    Final Clinical Impressions(s) / UC Diagnoses   Final diagnoses:  Acute non-recurrent maxillary sinusitis  Viral illness     Discharge Instructions      Continue symptomatic treatment for the next day or so.  If your symptoms are still not improving, start the amoxicillin.    Follow up with your primary care provider if your symptoms are not improving.        ED Prescriptions     Medication Sig Dispense Auth. Provider   amoxicillin (AMOXIL) 875 MG tablet Take 1 tablet (875 mg total) by mouth 2 (two) times daily for 10 days. 20 tablet Sharion Balloon, NP      PDMP not  reviewed this encounter.   Sharion Balloon, NP 12/13/20 [phone]

## 2020-12-14 ENCOUNTER — Ambulatory Visit: Payer: Self-pay

## 2020-12-15 LAB — COVID-19, FLU A+B NAA
Influenza A, NAA: NOT DETECTED
Influenza B, NAA: NOT DETECTED
SARS-CoV-2, NAA: NOT DETECTED

## 2021-03-01 IMAGING — US US ABDOMEN LIMITED
1 series · 14 of 17 positions shown · non-contrast
Comparison: November 13, 2007.

CLINICAL DATA: Acute lower abdominal pain.

EXAM:
ULTRASOUND ABDOMEN LIMITED

[Series 1: us abdomen limited · 0.20mm/px · 14 of 17 slices shown]
[im 1/17]
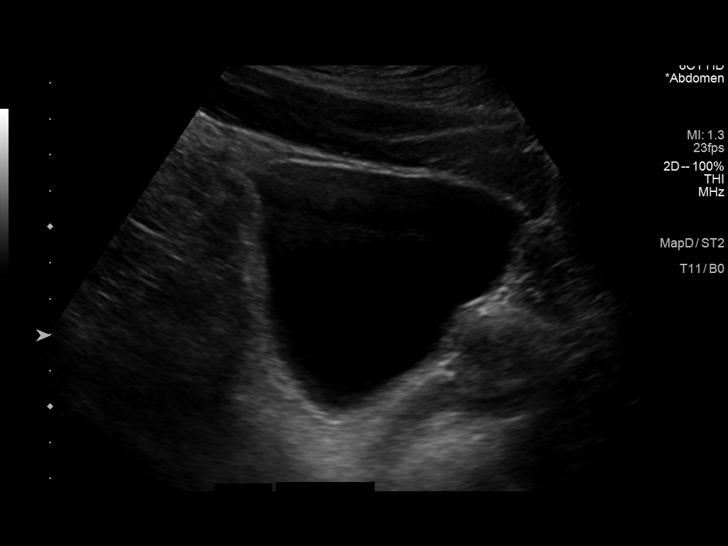
[im 2/17]
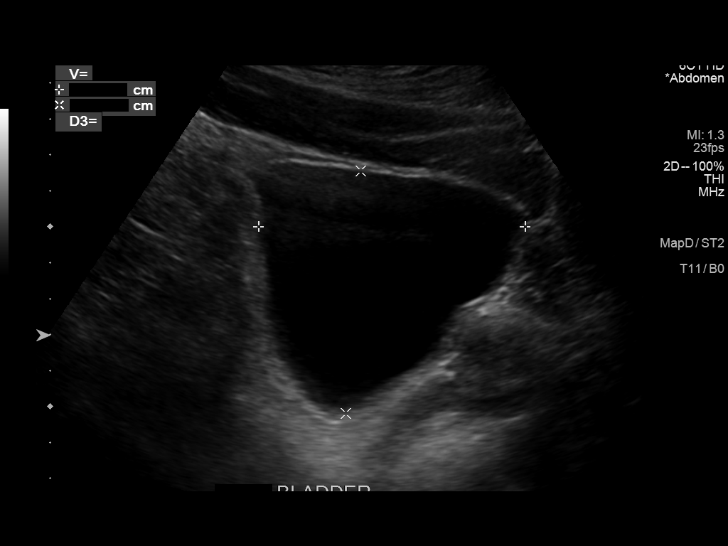
[im 4/17]
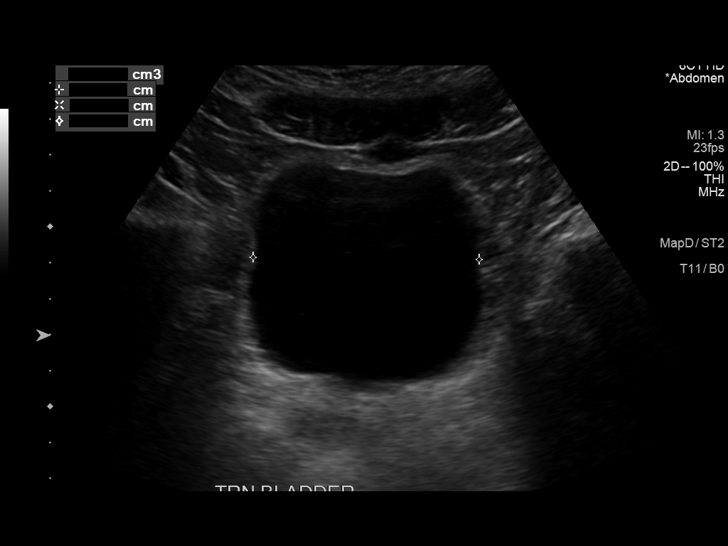
[im 5/17]
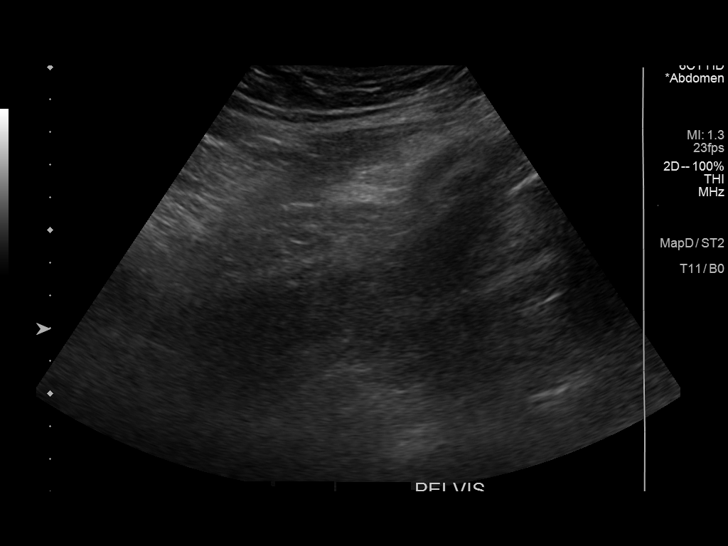
[im 6/17]
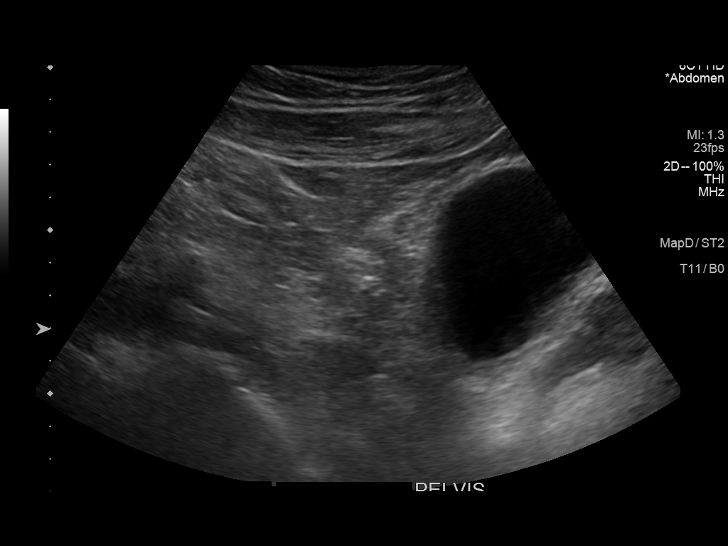
[im 7/17]
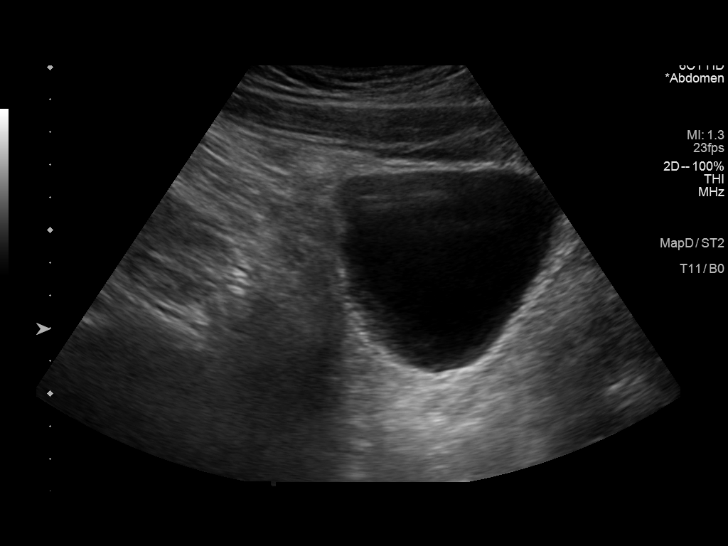
[im 8/17]
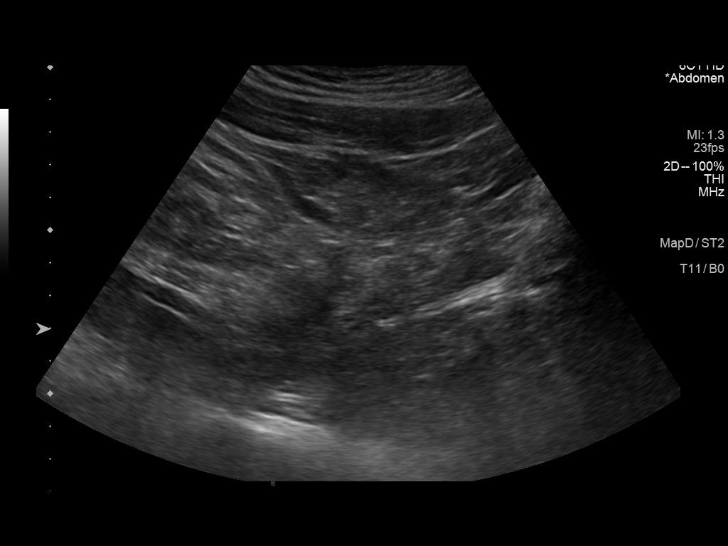
[im 10/17]
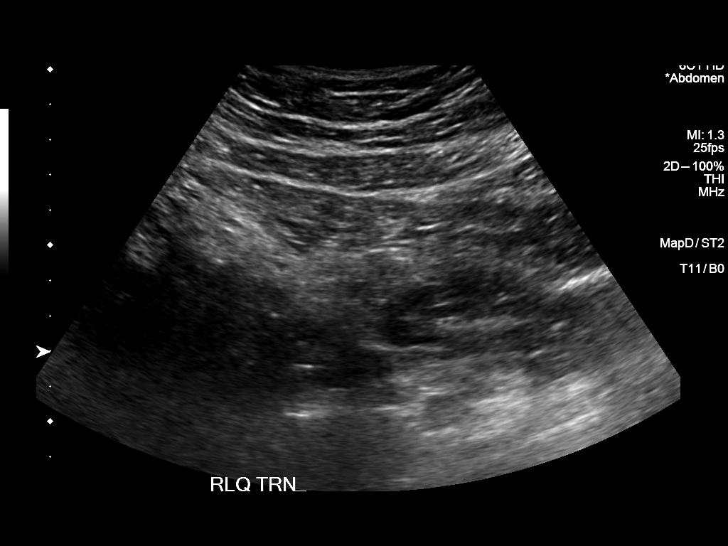
[im 11/17]
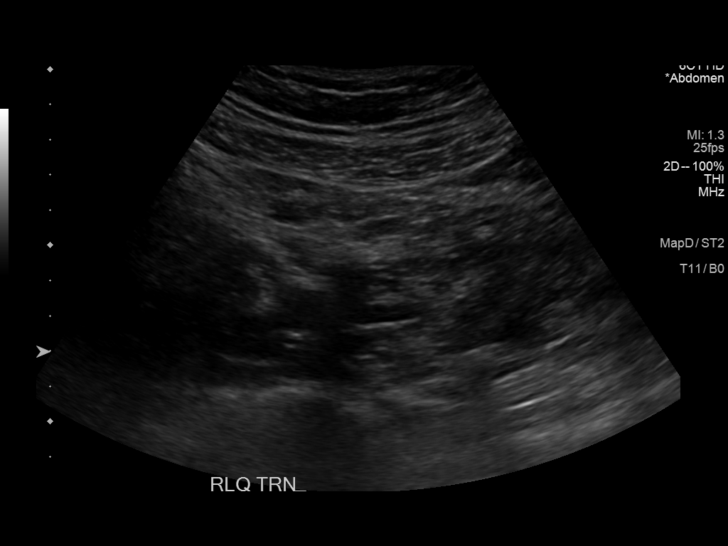
[im 12/17]
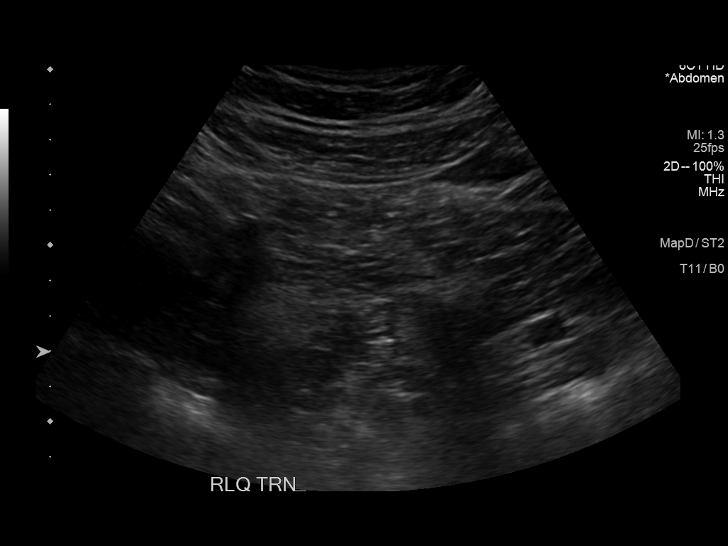
[im 13/17]
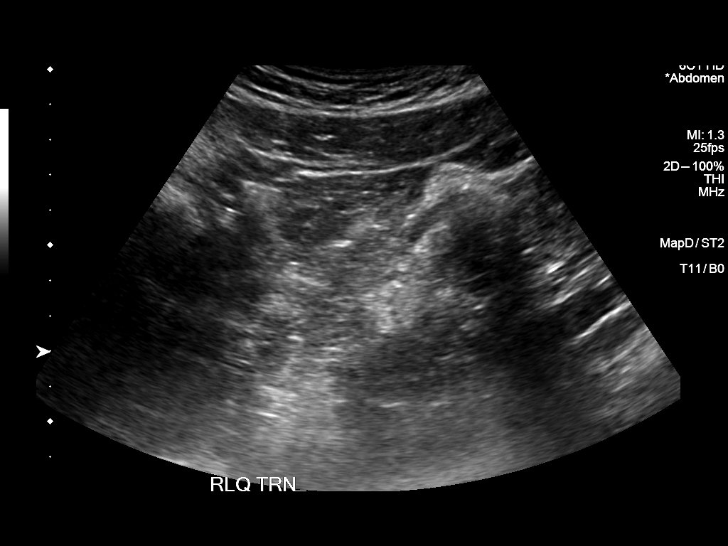
[im 14/17]
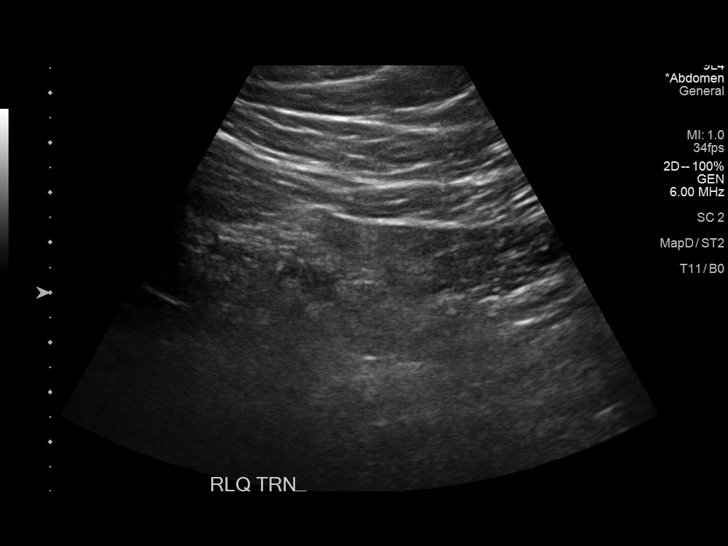
[im 16/17]
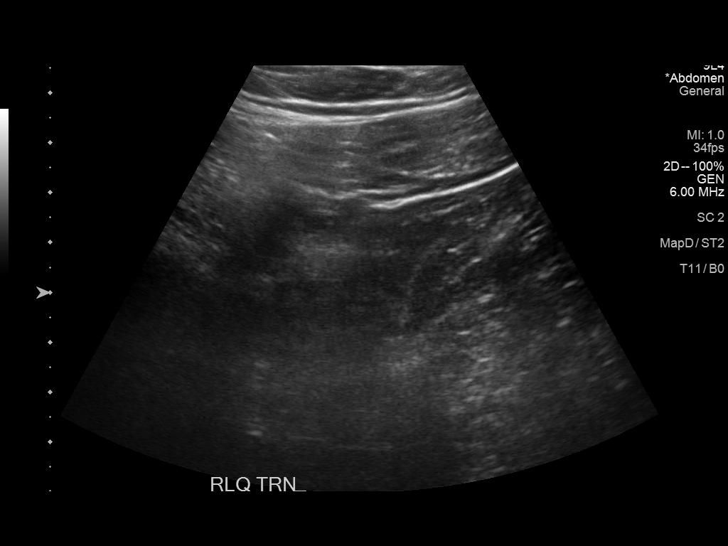
[im 17/17]
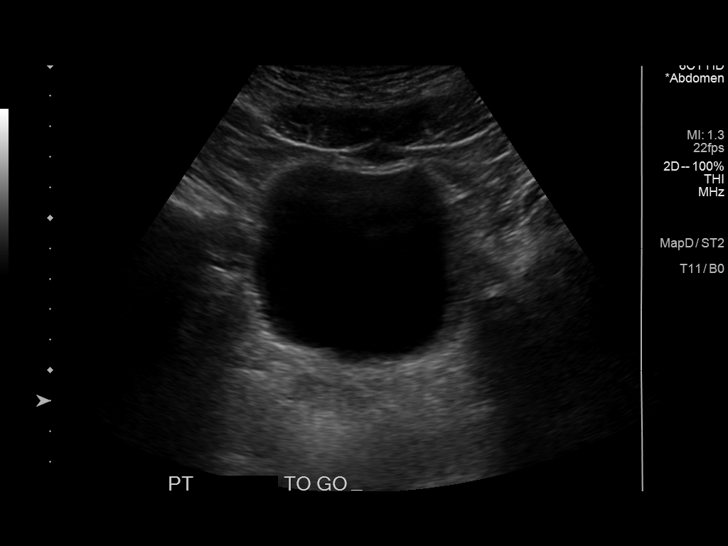

[14 of 17 positions shown; findings below may reference images not displayed]

FINDINGS: Limited sonographic evaluation was performed in the right lower
quadrant of the abdomen and the pelvis. No definite evidence of
mass, cyst or fluid collection is noted. The appendix is not
visualized. The urinary bladder is unremarkable. It has a calculated
prevoid volume of 165 mL. Postvoid volume could not be obtained as
the patient could not void.
IMPRESSION: No definite abnormality seen in this limited evaluation of the
abdomen.

## 2021-05-07 ENCOUNTER — Telehealth: Payer: Self-pay | Admitting: Physician Assistant

## 2021-05-07 ENCOUNTER — Ambulatory Visit (INDEPENDENT_AMBULATORY_CARE_PROVIDER_SITE_OTHER): Payer: 59 | Admitting: Physician Assistant

## 2021-05-07 ENCOUNTER — Encounter: Payer: Self-pay | Admitting: Physician Assistant

## 2021-05-07 VITALS — BP 113/80 | HR 84 | Temp 98.2°F | Resp 16 | Ht 73.0 in | Wt 261.0 lb

## 2021-05-07 DIAGNOSIS — Z720 Tobacco use: Secondary | ICD-10-CM

## 2021-05-07 DIAGNOSIS — F419 Anxiety disorder, unspecified: Secondary | ICD-10-CM

## 2021-05-07 DIAGNOSIS — E669 Obesity, unspecified: Secondary | ICD-10-CM

## 2021-05-07 DIAGNOSIS — Z0000 Encounter for general adult medical examination without abnormal findings: Secondary | ICD-10-CM | POA: Diagnosis not present

## 2021-05-07 MED ORDER — BUPROPION HCL ER (XL) 150 MG PO TB24: 150.0000 mg | ORAL_TABLET | Freq: Every day | ORAL | 0 refills | Status: DC

## 2021-05-07 NOTE — Progress Notes (Signed)
? ? ? ?I,Daishon Chui Robinson,acting as a Neurosurgeon for [employer], PA-C.,have documented all relevant documentation on the behalf of Debera Lat, PA-C,as directed by  [employer], PA-C while in the presence of [employer], PA-C. ? ?Complete physical exam ? ? ?Patient: Parker Heath   DOB: 06-01-1990   31 y.o. Male  MRN: [id number] ?Visit Date: 05/07/2021 ? ?Today's healthcare provider: Debera Lat, PA-C ? ?CC: CPE ?Subjective  ?  ?Parker Heath is a 31 y.o. male who presents today for a complete physical exam.  ?He reports consuming a general diet. The patient has a physically strenuous job, but has no regular exercise apart from work.  He generally feels well. He reports sleeping well. He does have additional problems to discuss today.  ? ?Reports pain in left buttocks and leg. Constant with sitting after a few minutes and goes away with up and walking.  Off and on x 2 mo.  Describes pain as sharp, shooting.  Takes Advil or Tylenol.    ? ?Patient reports he takes Alprazolam with travel on long trips and is requesting refill.  ?This is the most worrisome problem for today's visit. ?Patient reports anxiety only with traveling on long trips whether driving or flying.  ?Past Medical History:  ?Diagnosis Date  ? Anxiety   ? ?Past Surgical History:  ?Procedure Laterality Date  ? TOE SURGERY Left 2006  ? VASECTOMY  2021  ? Dr. Evelene Croon  ? ?Social History  ? ?Socioeconomic History  ? Marital status: Married  ?  Spouse name: Not on file  ? Number of children: 2  ? Years of education: GED  ? Highest education level: Not on file  ?Occupational History  ? Not on file  ?Tobacco Use  ? Smoking status: Former  ?  Years: 2.00  ?  Types: Cigarettes  ? Smokeless tobacco: Current  ?  Types: Chew  ?Vaping Use  ? Vaping Use: Never used  ?Substance and Sexual Activity  ? Alcohol use: No  ?  Alcohol/week: 0.0 standard drinks  ? Drug use: No  ? Sexual activity: Not on file  ?Other Topics Concern  ? Not on file  ?Social History Narrative   ? Not on file  ? ?Social Determinants of Health  ? ?Financial Resource Strain: Not on file  ?Food Insecurity: Not on file  ?Transportation Needs: Not on file  ?Physical Activity: Not on file  ?Stress: Not on file  ?Social Connections: Not on file  ?Intimate Partner Violence: Not on file  ? ?Family Status  ?Relation Name Status  ? Mother  Alive  ? Father  Alive  ? Sister  Alive  ? MGM  Alive  ? MGF  Deceased  ? PGM  Deceased  ? PGF  Deceased  ? Brother Tammy Sours Alive  ? ?Family History  ?Problem Relation Age of Onset  ? Prostate cancer Father   ? Heart disease Maternal Grandmother   ? Anxiety disorder Brother   ? Heart Problems Brother   ? ?No Known Allergies  ?Patient Care Team: ?Malva Limes, MD as PCP - General (Family Medicine)  ? ?Medications: ?Outpatient Medications Prior to Visit  ?Medication Sig  ? ALPRAZolam (XANAX) 0.5 MG tablet Take 0.5-1 tablets (0.25-0.5 mg total) by mouth every 4 (four) hours as needed for anxiety.  ? naproxen (NAPROSYN) 500 MG tablet Take 1 tablet (500 mg total) by mouth 2 (two) times daily with a meal.  ? promethazine-dextromethorphan (PROMETHAZINE-DM) 6.25-15 MG/5ML syrup Take 5 mLs  by mouth at bedtime as needed.  ? ?No facility-administered medications prior to visit.  ? ? ?Review of Systems  ?Constitutional: Negative.   ?Respiratory: Negative.    ?Cardiovascular: Negative.   ?Gastrointestinal: Negative.   ?Musculoskeletal:  Positive for back pain.  ?All other systems reviewed and are negative. ? ? ? Objective  ?  ?BP 113/80 (BP Location: Right Arm, Patient Position: Sitting, Cuff Size: Large)   Pulse 84   Temp 98.2 ?F (36.8 ?C) (Oral)   Resp 16   Ht 6\' 1"  (1.854 m)   Wt 261 lb (118.4 kg)   SpO2 97%   BMI 34.43 kg/m?  ? ? ? ?Physical Exam ?Vitals and nursing note reviewed.  ?Constitutional:   ?   Appearance: Normal appearance. He is obese.  ?HENT:  ?   Head: Normocephalic and atraumatic.  ?   Right Ear: Tympanic membrane normal.  ?   Left Ear: Tympanic membrane normal.   ?Eyes:  ?   Extraocular Movements: Extraocular movements intact.  ?   Pupils: Pupils are equal, round, and reactive to light.  ?Cardiovascular:  ?   Rate and Rhythm: Normal rate and regular rhythm.  ?   Pulses: Normal pulses.  ?   Heart sounds: Normal heart sounds.  ?Pulmonary:  ?   Effort: Pulmonary effort is normal.  ?   Breath sounds: Normal breath sounds.  ?Abdominal:  ?   General: Abdomen is flat. Bowel sounds are normal.  ?   Palpations: Abdomen is soft.  ?Musculoskeletal:     ?   General: No swelling.  ?   Right lower leg: No edema.  ?   Left lower leg: No edema.  ?   Comments: Straight Leg Raise Test Negative  ?Neurological:  ?   General: No focal deficit present.  ?   Mental Status: He is alert and oriented to person, place, and time.  ?Psychiatric:     ?   Behavior: Behavior normal.     ?   Thought Content: Thought content normal.     ?   Judgment: Judgment normal.  ?   ? ?Last depression screening scores ? ?  05/07/2021  ?  1:54 PM 10/29/2019  ? 10:51 AM 02/15/2018  ? 11:06 AM  ?PHQ 2/9 Scores  ?PHQ - 2 Score 0 0 2  ?PHQ- 9 Score 0  11  ? ?Last fall risk screening ? ?  05/07/2021  ?  1:53 PM  ?Fall Risk   ?Falls in the past year? 0  ?Number falls in past yr: 0  ?Injury with Fall? 0  ? ?Last Audit-C alcohol use screening ? ?  05/07/2021  ?  1:54 PM  ?Alcohol Use Disorder Test (AUDIT)  ?1. How often do you have a drink containing alcohol? 0  ?2. How many drinks containing alcohol do you have on a typical day when you are drinking? 0  ?3. How often do you have six or more drinks on one occasion? 0  ?AUDIT-C Score 0  ? ?A score of 3 or more in women, and 4 or more in men indicates increased risk for alcohol abuse, EXCEPT if all of the points are from question 1  ? ?No results found for any visits on 05/07/21. ? Assessment & Plan  ?  ?Routine Health Maintenance and Physical Exam ? ?Exercise Activities and Dietary recommendations ? Goals   ?None ?  ? ? ?Immunization History  ?Administered Date(s) Administered  ?  Influenza-Unspecified 11/09/2014  ? ? ?  Health Maintenance  ?Topic Date Due  ? COVID-19 Vaccine (1) Never done  ? HIV Screening  Never done  ? Hepatitis C Screening  Never done  ? INFLUENZA VACCINE  05/08/2021 (Originally 09/08/2020)  ? TETANUS/TDAP  05/08/2022 (Originally 03/30/2011)  ? HPV VACCINES  Aged Out  ? ? ?Discussed health benefits of physical activity, and encouraged him to engage in regular exercise appropriate for his age and condition. ? ?1. Annual physical exam ?Doing well ?- CBC with Differential/Platelet ?- Comprehensive metabolic panel ?- Lipid panel ? ?2. Anxiety ?GAD7 was  ?- buPROPion (WELLBUTRIN XL) 150 MG 24 hr tablet; Take 1 tablet (150 mg total) by mouth daily.  Dispense: 30 tablet; Refill: 0 ?Benefits and risks for taking medication were discussed. Patient agreed and expressed his understanding.  ? ?3. Tobacco chew use ?With increase risk for oral cancer and increased risk of cardiovascular disease, we recommended tobacco cessation. ?- buPROPion (WELLBUTRIN XL) 150 MG 24 hr tablet; Take 1 tablet (150 mg total) by mouth daily.  Dispense: 30 tablet; Refill: 0 ?Benefits and risks for taking medication were discussed. Patient agreed and expressed his understanding.  ? ?4. Obesity (BMI 30.0-34.9) ?Lifestyle modification ?- buPROPion (WELLBUTRIN XL) 150 MG 24 hr tablet; Take 1 tablet (150 mg total) by mouth daily.  Dispense: 30 tablet; Refill: 0 ?Benefits and risks for taking medication were discussed. Patient agreed and expressed his understanding.  ? ?5. Musculoskeletal pain ?- well managed. Continue OTC pain medications, ice and heat packs as needed. ?Might consider muscle relaxant and referral to PT if patient agrees. ?The patient was advised to call back or seek an in-person evaluation if the sym ?ptoms worsen or if the condition fails to improve as anticipated. ? ?Work note was provided for todaySarina Ill printed) ?I discussed the assessment and treatment plan with the patient. The  patient was provided an opportunity to ask questions and all were answered. The patient agreed with the plan and demonstrated an understanding of the instructions. ? ?The entirety of the information documented in the [facility]

## 2021-05-07 NOTE — Progress Notes (Deleted)
? ? ?New patient visit ? ? ?Patient: Parker Heath   DOB: 25-Sep-1990   31 y.o. Male  MRN: [id number] ?Visit Date: 05/07/2021 ? ?Today's healthcare provider: Mardene Speak, PA-C  ? ?No chief complaint on file. ? ?Subjective  ?  ?Parker Heath is a 31 y.o. male who presents today as a new patient to establish care.  ?HPI  ?*** ? ?Tetanus ?Substance  ?Depression ?Diet obesty ?Std hiv hep b  ? ?Past Medical History:  ?Diagnosis Date  ?? Anxiety   ? ?Past Surgical History:  ?Procedure Laterality Date  ?? TOE SURGERY Left 2006  ?? VASECTOMY  2021  ? Dr. Yves Dill  ? ?Family Status  ?Relation Name Status  ?? Mother  Alive  ?? Father  Alive  ?? Sister  Alive  ?? MGM  Alive  ?? MGF  Deceased  ?? PGM  Deceased  ?? PGF  Deceased  ?? Brother Marya Amsler Alive  ? ?Family History  ?Problem Relation Age of Onset  ?? Prostate cancer Father   ?? Heart disease Maternal Grandmother   ?? Anxiety disorder Brother   ?? Heart Problems Brother   ? ?Social History  ? ?Socioeconomic History  ?? Marital status: Married  ?  Spouse name: Not on file  ?? Number of children: 2  ?? Years of education: GED  ?? Highest education level: Not on file  ?Occupational History  ?? Not on file  ?Tobacco Use  ?? Smoking status: Former  ?  Years: 2.00  ?  Types: Cigarettes  ?? Smokeless tobacco: Never  ?Vaping Use  ?? Vaping Use: Never used  ?Substance and Sexual Activity  ?? Alcohol use: No  ?  Alcohol/week: 0.0 standard drinks  ?? Drug use: No  ?? Sexual activity: Not on file  ?Other Topics Concern  ?? Not on file  ?Social History Narrative  ?? Not on file  ? ?Social Determinants of Health  ? ?Financial Resource Strain: Not on file  ?Food Insecurity: Not on file  ?Transportation Needs: Not on file  ?Physical Activity: Not on file  ?Stress: Not on file  ?Social Connections: Not on file  ? ?Outpatient Medications Prior to Visit  ?Medication Sig  ?? ALPRAZolam (XANAX) 0.5 MG tablet Take 0.5-1 tablets (0.25-0.5 mg total) by mouth every 4 (four) hours as needed for  anxiety.  ?? naproxen (NAPROSYN) 500 MG tablet Take 1 tablet (500 mg total) by mouth 2 (two) times daily with a meal.  ?? promethazine-dextromethorphan (PROMETHAZINE-DM) 6.25-15 MG/5ML syrup Take 5 mLs by mouth at bedtime as needed.  ? ?No facility-administered medications prior to visit.  ? ?No Known Allergies ? ?Immunization History  ?Administered Date(s) Administered  ?? Influenza-Unspecified 11/09/2014  ? ? ?Health Maintenance  ?Topic Date Due  ?? COVID-19 Vaccine (1) Never done  ?? HIV Screening  Never done  ?? Hepatitis C Screening  Never done  ?? TETANUS/TDAP  Never done  ?? INFLUENZA VACCINE  09/08/2020  ?? HPV VACCINES  Aged Out  ? ? ?Patient Care Team: ?Birdie Sons, MD as PCP - General (Family Medicine) ? ?Review of Systems ? ?{Labs  Heme  Chem  Endocrine  Serology  Results Review (optional):23779} ? ? Objective  ?  ?There were no vitals taken for this visit. ?{Show previous vital signs (optional):23777} ? ?Physical Exam ?*** ? ?Depression Screen ? ?  10/29/2019  ? 10:51 AM 02/15/2018  ? 11:06 AM 01/31/2015  ? 10:30 AM  ?PHQ 2/9 Scores  ?PHQ - 2  Score 0 2 0  ?PHQ- 9 Score  11 2  ? ?No results found for any visits on 05/07/21. ? Assessment & Plan   ?  ?*** ? ?No follow-ups on file.  ?  ? ? ?The patient was advised to call back or seek an in-person evaluation if the symptoms worsen or if the condition fails to improve as anticipated. ? ?I discussed the assessment and treatment plan with the patient. The patient was provided an opportunity to ask questions and all were answered. The patient agreed with the plan and demonstrated an understanding of the instructions. ? ?The entirety of the information documented in the History of Present Illness, Review of Systems and Physical Exam were personally obtained by me. Portions of this information were initially documented by the CMA and reviewed by me for thoroughness and accuracy.   ? ?Mardene Speak, PA-C  ?[place] ?[phone]  (phone) ?[phone] (fax) ? ?[facility]  ?

## 2021-05-10 DIAGNOSIS — Z720 Tobacco use: Secondary | ICD-10-CM | POA: Insufficient documentation

## 2021-05-10 DIAGNOSIS — E669 Obesity, unspecified: Secondary | ICD-10-CM | POA: Insufficient documentation

## 2021-05-21 ENCOUNTER — Other Ambulatory Visit: Payer: Self-pay | Admitting: Physician Assistant

## 2021-05-21 DIAGNOSIS — F419 Anxiety disorder, unspecified: Secondary | ICD-10-CM

## 2021-05-21 NOTE — Telephone Encounter (Signed)
Requested medication (s) are due for refill today: No ? ?Requested medication (s) are on the active medication list: Yes ? ?Last refill:  05/07/21 ? ?Future visit scheduled: Yes ? ?Notes to clinic:  Pharmacy request 90 day supply. ? ? ? ?Requested Prescriptions  ?Pending Prescriptions Disp Refills  ? buPROPion (WELLBUTRIN XL) 150 MG 24 hr tablet [Pharmacy Med Name: BUPROPION HCL XL 150 MG TABLET] 90 tablet 1  ?  Sig: TAKE 1 TABLET BY MOUTH EVERY DAY  ?  ? Psychiatry: Antidepressants - bupropion Failed - 05/21/2021  8:57 AM  ?  ?  Failed - Cr in normal range and within 360 days  ?  Creatinine, Ser  ?Date Value Ref Range Status  ?08/24/2019 0.92 0.61 - 1.24 mg/dL Final  ?  ?  ?  ?  Failed - AST in normal range and within 360 days  ?  AST  ?Date Value Ref Range Status  ?02/15/2018 16 0 - 40 IU/L Final  ?  ?  ?  ?  Failed - ALT in normal range and within 360 days  ?  ALT  ?Date Value Ref Range Status  ?02/15/2018 21 0 - 44 IU/L Final  ?  ?  ?  ?  Passed - Last BP in normal range  ?  BP Readings from Last 1 Encounters:  ?05/07/21 113/80  ?  ?  ?  ?  Passed - Valid encounter within last 6 months  ?  Recent Outpatient Visits   ? ?      ? 2 weeks ago Annual physical exam  ? [facility], [place], PA-C  ? 1 year ago COVID-19  ? [place], [place]  ? 1 year ago Pain in right testicle  ? [facility] Caryn Section, Kirstie Peri, MD  ? 1 year ago No-show for appointment  ? [facility] Birdie Sons, MD  ? 3 years ago Weight loss  ? [facility] Caryn Section, Kirstie Peri, MD  ? ?  ?  ?Future Appointments   ? ?        ? In 2 weeks Mardene Speak, PA-C [employer]  ? ?  ? ?  ?  ?  ? ?

## 2021-05-22 ENCOUNTER — Telehealth: Payer: Self-pay | Admitting: Physician Assistant

## 2021-05-22 NOTE — Telephone Encounter (Signed)
Per [facility] this was not received.   ?

## 2021-05-22 NOTE — Telephone Encounter (Signed)
Please advise refill? ?  ?LOV: 05/07/2021 ?NOV: 06/08/2021 ?Last refill: 05/07/2021 #30 w/0 refills ? ?

## 2021-05-22 NOTE — Telephone Encounter (Signed)
Please, contact [facility] regarding status of this patient's lab results from 05/07/21. Thank you ?

## 2021-05-25 NOTE — Telephone Encounter (Signed)
Pt states he forgot to go to lab when here at last visit.  He will come to lab soon and given lab hours.   ?

## 2021-06-05 NOTE — Progress Notes (Deleted)
I,Jana Trianna Lupien,acting as a Neurosurgeon for [employer], PA-C.,have documented all relevant documentation on the behalf of Debera Lat, PA-C,as directed by  Debera Lat, PA-C while in the presence of Debera Lat, PA-C.'  Established patient visit   Patient: Parker Heath   DOB: Nov 30, 1990   31 y.o. Male  MRN: [id number] Visit Date: 06/08/2021  Today's healthcare provider: Debera Lat, PA-C   No chief complaint on file.  Subjective    HPI  Anxiety, Follow-up  He was last seen for anxiety 1 months ago. Changes made at last visit include bupropion 150 mg once daily.   He reports {excellent/good/fair/poor:19665} compliance with treatment. He reports {good/fair/poor:18685} tolerance of treatment. He {is/is not:21021397} having side effects. {document side effects if present:1}  He feels his anxiety is {Desc; severity:60313} and {improved/worse/unchanged:3041574} since last visit.  Symptoms: {Yes/No:20286} chest pain {Yes/No:20286} difficulty concentrating  {Yes/No:20286} dizziness {Yes/No:20286} fatigue  {Yes/No:20286} feelings of losing control {Yes/No:20286} insomnia  {Yes/No:20286} irritable {Yes/No:20286} palpitations  {Yes/No:20286} panic attacks {Yes/No:20286} racing thoughts  {Yes/No:20286} shortness of breath {Yes/No:20286} sweating  {Yes/No:20286} tremors/shakes    GAD-7 Results     View : No data to display.          PHQ-9 Scores    05/07/2021    1:54 PM 10/29/2019   10:51 AM 02/15/2018   11:06 AM  PHQ9 SCORE ONLY  PHQ-9 Total Score 0 0 11    ---------------------------------------------------------------------------------------------------  Follow up for tobacco chew cessation   The patient was last seen for this 1 months ago. Changes made at last visit include bupropion 150 mg once daily.  He reports {excellent/good/fair/poor:19665} compliance with treatment. He feels that condition is {improved/worse/unchanged:3041574}. He {is/is  not:21021397} having side effects. ***  -----------------------------------------------------------------------------------------  Follow up for musculoskeletal pain   The patient was last seen for this 1 months ago. Changes made at last visit include: Continue OTC pain medications, ice and heat packs as needed. Might consider muscle relaxant and referral to PT if patient agrees.  He reports {excellent/good/fair/poor:19665} compliance with treatment. He feels that condition is {improved/worse/unchanged:3041574}. He {is/is not:21021397} having side effects. ***  -----------------------------------------------------------------------------------------  Medications: Outpatient Medications Prior to Visit  Medication Sig  . ALPRAZolam (XANAX) 0.5 MG tablet Take 0.5-1 tablets (0.25-0.5 mg total) by mouth every 4 (four) hours as needed for anxiety.  Marland Kitchen buPROPion (WELLBUTRIN XL) 150 MG 24 hr tablet Take 1 tablet (150 mg total) by mouth daily.  . naproxen (NAPROSYN) 500 MG tablet Take 1 tablet (500 mg total) by mouth 2 (two) times daily with a meal.   No facility-administered medications prior to visit.    Review of Systems  {Labs  Heme  Chem  Endocrine  Serology  Results Review (optional):23779}   Objective    There were no vitals taken for this visit. {Show previous vital signs (optional):23777}  Physical Exam  ***  No results found for any visits on 06/08/21.  Assessment & Plan     Smokeless tobacco is associated with many health problems. Using smokeless tobacco: Can lead to nicotine addiction. Causes cancer of the mouth, esophagus (the passage that connects the throat to the stomach), and pancreas (a gland that helps with digestion and maintaining proper blood sugar levels) FU    The patient was advised to call back or seek an in-person evaluation if the symptoms worsen or if the condition fails to improve as anticipated.  I discussed the assessment and treatment plan with  the patient. The patient was provided  an opportunity to ask questions and all were answered. The patient agreed with the plan and demonstrated an understanding of the instructions.  The entirety of the information documented in the History of Present Illness, Review of Systems and Physical Exam were personally obtained by me. Portions of this information were initially documented by the CMA and reviewed by me for thoroughness and accuracy.    Portions of this note were created using dictation software and may contain typographical errors.   Debera Lat, PA-C  [facility] [phone] (phone) [phone] (fax)  [facility]

## 2021-06-08 ENCOUNTER — Ambulatory Visit: Payer: 59 | Admitting: Physician Assistant

## 2021-07-08 ENCOUNTER — Encounter: Payer: Self-pay | Admitting: Family Medicine

## 2021-07-09 ENCOUNTER — Other Ambulatory Visit: Payer: Self-pay | Admitting: Family Medicine

## 2021-07-09 DIAGNOSIS — F418 Other specified anxiety disorders: Secondary | ICD-10-CM

## 2021-07-09 DIAGNOSIS — F40248 Other situational type phobia: Secondary | ICD-10-CM

## 2021-07-09 MED ORDER — ALPRAZOLAM 0.5 MG PO TABS: 0.2500 mg | ORAL_TABLET | ORAL | 1 refills | Status: DC | PRN

## 2021-07-10 ENCOUNTER — Telehealth: Payer: 59 | Admitting: Family Medicine

## 2021-07-10 NOTE — Progress Notes (Deleted)
    MyChart Video Visit    Virtual Visit via Video Note   This visit type was conducted due to national recommendations for restrictions regarding the COVID-19 Pandemic (e.g. social distancing) in an effort to limit this patient's exposure and mitigate transmission in our community. This patient is at least at moderate risk for complications without adequate follow up. This format is felt to be most appropriate for this patient at this time. Physical exam was limited by quality of the video and audio technology used for the visit.   Patient location: *** Provider location: ***  I discussed the limitations of evaluation and management by telemedicine and the availability of in person appointments. The patient expressed understanding and agreed to proceed.  Patient: Parker Heath   DOB: April 09, 1990   31 y.o. Male  MRN: [id number] Visit Date: 07/10/2021  Today's healthcare provider: Mila Merry, MD   No chief complaint on file.  Subjective    HPI  Anxiety, Follow-up  He was last seen for anxiety on 05/07/2021 (seen by Debera Lat, PA-C).   Changes made at last visit include starting buPROPion (WELLBUTRIN XL) 150 MG 24 hr tablet; Take 1 tablet (150 mg total) by mouth daily.   He reports {excellent/good/fair/poor:19665} compliance with treatment. He reports {good/fair/poor:18685} tolerance of treatment. He {is/is not:21021397} having side effects. {document side effects if present:1}  He feels his anxiety is {Desc; severity:60313} and {improved/worse/unchanged:3041574} since last visit.  Symptoms: {Yes/No:20286} chest pain {Yes/No:20286} difficulty concentrating  {Yes/No:20286} dizziness {Yes/No:20286} fatigue  {Yes/No:20286} feelings of losing control {Yes/No:20286} insomnia  {Yes/No:20286} irritable {Yes/No:20286} palpitations  {Yes/No:20286} panic attacks {Yes/No:20286} racing thoughts  {Yes/No:20286} shortness of breath {Yes/No:20286} sweating  {Yes/No:20286}  tremors/shakes    GAD-7 Results     View : No data to display.          PHQ-9 Scores    05/07/2021    1:54 PM 10/29/2019   10:51 AM 02/15/2018   11:06 AM  PHQ9 SCORE ONLY  PHQ-9 Total Score 0 0 11    ---------------------------------------------------------------------------------------------------    Medications: Outpatient Medications Prior to Visit  Medication Sig   ALPRAZolam (XANAX) 0.5 MG tablet Take 0.5-1 tablets (0.25-0.5 mg total) by mouth every 4 (four) hours as needed for anxiety.   buPROPion (WELLBUTRIN XL) 150 MG 24 hr tablet Take 1 tablet (150 mg total) by mouth daily.   naproxen (NAPROSYN) 500 MG tablet Take 1 tablet (500 mg total) by mouth 2 (two) times daily with a meal.   No facility-administered medications prior to visit.    Review of Systems  {Labs  Heme  Chem  Endocrine  Serology  Results Review (optional):23779}   Objective    There were no vitals taken for this visit.  {Show previous vital signs (optional):23777}   Physical Exam     Assessment & Plan     ***  No follow-ups on file.     I discussed the assessment and treatment plan with the patient. The patient was provided an opportunity to ask questions and all were answered. The patient agreed with the plan and demonstrated an understanding of the instructions.   The patient was advised to call back or seek an in-person evaluation if the symptoms worsen or if the condition fails to improve as anticipated.  I provided *** minutes of non-face-to-face time during this encounter.  {provider attestation***:1}  Mila Merry, MD [facility] [phone] (phone) [phone] (fax)  [facility]

## 2022-02-11 ENCOUNTER — Ambulatory Visit: Payer: 59 | Admitting: Family Medicine

## 2022-02-11 ENCOUNTER — Encounter: Payer: Self-pay | Admitting: Family Medicine

## 2022-02-11 VITALS — BP 109/77 | HR 101 | Temp 99.2°F | Resp 16 | Wt 260.4 lb

## 2022-02-11 DIAGNOSIS — R509 Fever, unspecified: Secondary | ICD-10-CM | POA: Diagnosis not present

## 2022-02-11 DIAGNOSIS — R051 Acute cough: Secondary | ICD-10-CM

## 2022-02-11 DIAGNOSIS — R6889 Other general symptoms and signs: Secondary | ICD-10-CM | POA: Diagnosis not present

## 2022-02-11 LAB — POCT INFLUENZA A/B
Influenza A, POC: NEGATIVE
Influenza B, POC: POSITIVE — AB

## 2022-02-11 LAB — POC COVID19 BINAXNOW: SARS Coronavirus 2 Ag: NEGATIVE

## 2022-02-11 MED ORDER — OSELTAMIVIR PHOSPHATE 75 MG PO CAPS: 75.0000 mg | ORAL_CAPSULE | Freq: Two times a day (BID) | ORAL | 0 refills | Status: AC

## 2022-02-11 MED ORDER — ALBUTEROL SULFATE HFA 108 (90 BASE) MCG/ACT IN AERS: 2.0000 | INHALATION_SPRAY | Freq: Four times a day (QID) | RESPIRATORY_TRACT | 2 refills | Status: AC | PRN

## 2022-02-11 NOTE — Assessment & Plan Note (Signed)
Known exposure Low grade temp for 5 days Symptoms of high temp, not responding to APAP or IBU x 36-48 hours Recommend tamiflu and supportive measures given Flu B positive on POC

## 2022-02-11 NOTE — Progress Notes (Signed)
I,Joseline E Rosas,acting as a scribe for Gwyneth Sprout, FNP.,have documented all relevant documentation on the behalf of Gwyneth Sprout, FNP,as directed by  Gwyneth Sprout, FNP while in the presence of Gwyneth Sprout, FNP.   Established patient visit   Patient: Parker Heath   DOB: Dec 15, 1990   32 y.o. Male  MRN: [id number] Visit Date: 02/11/2022  Today's healthcare provider: Gwyneth Sprout, FNP  Introduced to nurse practitioner role and practice setting.  All questions answered.  Discussed provider/patient relationship and expectations.   Chief Complaint  Patient presents with   Cough   Subjective    Cough This is a new problem. The current episode started in the past 7 days (Since Thursday night). The problem has been unchanged. The problem occurs constantly. The cough is Productive of sputum. Associated symptoms include ear congestion, a fever (highest 103.5), headaches, nasal congestion, postnasal drip and rhinorrhea. Pertinent negatives include no wheezing. Nothing aggravates the symptoms. He has tried OTC cough suppressant (Mucinex, Tylenol and IBU) for the symptoms.    Medications: Outpatient Medications Prior to Visit  Medication Sig   ALPRAZolam (XANAX) 0.5 MG tablet Take 0.5-1 tablets (0.25-0.5 mg total) by mouth every 4 (four) hours as needed for anxiety.   buPROPion (WELLBUTRIN XL) 150 MG 24 hr tablet Take 1 tablet (150 mg total) by mouth daily.   naproxen (NAPROSYN) 500 MG tablet Take 1 tablet (500 mg total) by mouth 2 (two) times daily with a meal.   No facility-administered medications prior to visit.    Review of Systems  Constitutional:  Positive for fever (highest 103.5).  HENT:  Positive for postnasal drip and rhinorrhea.   Respiratory:  Positive for cough. Negative for wheezing.   Neurological:  Positive for headaches.     Objective    BP 109/77 (BP Location: Right Arm, Patient Position: Sitting, Cuff Size: Large)   Pulse (!) 101   Temp 99.2 F (37.3  C) (Oral)   Resp 16   Wt 260 lb 6.4 oz (118.1 kg)   BMI 34.36 kg/m   Physical Exam Vitals and nursing note reviewed.  Constitutional:      General: He is not in acute distress.    Appearance: Normal appearance. He is obese. He is not ill-appearing, toxic-appearing or diaphoretic.  HENT:     Head: Normocephalic and atraumatic.     Right Ear: Tympanic membrane, ear canal and external ear normal.     Left Ear: Tympanic membrane, ear canal and external ear normal.     Ears:     Comments: Excessive wax in both ear canals; recommend use of oral anti-histamine as well as intra-nasal steroid to assist     Mouth/Throat:     Mouth: Mucous membranes are moist.     Pharynx: Oropharyngeal exudate and posterior oropharyngeal erythema present.     Comments: Declines strep testing; believes thyroids with plaquing are in setting of recent URI and flu like s/s Eyes:     Conjunctiva/sclera: Conjunctivae normal.     Comments: Complaints of eye pain and irritation; normal appearance, no discharge   Cardiovascular:     Rate and Rhythm: Normal rate and regular rhythm.     Pulses: Normal pulses.     Heart sounds: Normal heart sounds.  Pulmonary:     Effort: Pulmonary effort is normal.     Breath sounds: Normal breath sounds.     Comments: Chest tenderness in s/o cough, upper airway inflammation d/t  viral load with cough Musculoskeletal:        General: Normal range of motion.     Cervical back: Normal range of motion.  Skin:    General: Skin is warm and dry.     Capillary Refill: Capillary refill takes less than 2 seconds.  Neurological:     General: No focal deficit present.     Mental Status: He is alert and oriented to person, place, and time. Mental status is at baseline.  Psychiatric:        Mood and Affect: Mood normal.        Behavior: Behavior normal.        Thought Content: Thought content normal.        Judgment: Judgment normal.     No results found for any visits on 02/11/22.   Assessment & Plan     Problem List Items Addressed This Visit       Other   Acute cough    In setting of recent URI over Christmas/new years; worsening with increase in fevers and sinus complaints       Relevant Medications   albuterol (VENTOLIN HFA) 108 (90 Base) MCG/ACT inhaler   Fever    Unknown cause; not vaccinated this season for flu or covid      Relevant Orders   POCT Influenza A/B   POC COVID-19   Flu-like symptoms - Primary    Known exposure Low grade temp for 5 days Symptoms of high temp, not responding to APAP or IBU x 36-48 hours Recommend tamiflu and supportive measures given Flu B positive on POC      Relevant Medications   oseltamivir (TAMIFLU) 75 MG capsule   albuterol (VENTOLIN HFA) 108 (90 Base) MCG/ACT inhaler   Other Relevant Orders   POCT Influenza A/B   POC COVID-19   Return if symptoms worsen or fail to improve.     Vonna Kotyk, FNP, have reviewed all documentation for this visit. The documentation on 02/11/22 for the exam, diagnosis, procedures, and orders are all accurate and complete.  Gwyneth Sprout, [place] [phone] (phone) [phone] (fax)  [place]

## 2022-02-11 NOTE — Assessment & Plan Note (Signed)
In setting of recent URI over Christmas/new years; worsening with increase in fevers and sinus complaints

## 2022-02-11 NOTE — Assessment & Plan Note (Signed)
Unknown cause; not vaccinated this season for flu or covid

## 2022-07-01 ENCOUNTER — Encounter: Payer: Self-pay | Admitting: Family Medicine

## 2022-07-01 DIAGNOSIS — F40248 Other situational type phobia: Secondary | ICD-10-CM

## 2022-07-01 DIAGNOSIS — R4589 Other symptoms and signs involving emotional state: Secondary | ICD-10-CM

## 2022-07-02 MED ORDER — ALPRAZOLAM 0.5 MG PO TABS: 0.2500 mg | ORAL_TABLET | ORAL | 1 refills | Status: AC | PRN

## 2022-07-26 NOTE — Telephone Encounter (Signed)
Error
